# Patient Record
Sex: Male | Born: 1995 | Race: White | Hispanic: No | Marital: Single | Smoking: Former smoker
Health system: Southern US, Community
[De-identification: ages and names within clinical notes are randomized; demographics above are authoritative.]

## PROBLEM LIST (undated history)

## (undated) ENCOUNTER — Ambulatory Visit (HOSPITAL_COMMUNITY): Discharge status: Against Medical Advice | Payer: Medicaid Other

## (undated) DIAGNOSIS — J45909 Unspecified asthma, uncomplicated: Secondary | ICD-10-CM

## (undated) DIAGNOSIS — J020 Streptococcal pharyngitis: Secondary | ICD-10-CM

## (undated) DIAGNOSIS — F909 Attention-deficit hyperactivity disorder, unspecified type: Secondary | ICD-10-CM

## (undated) DIAGNOSIS — J309 Allergic rhinitis, unspecified: Secondary | ICD-10-CM

## (undated) HISTORY — DX: Unspecified asthma, uncomplicated: J45.909

## (undated) HISTORY — DX: Attention-deficit hyperactivity disorder, unspecified type: F90.9

## (undated) HISTORY — PX: TYMPANOSTOMY TUBE PLACEMENT: SHX32

## (undated) HISTORY — DX: Allergic rhinitis, unspecified: J30.9

## (undated) HISTORY — PX: TONSILLECTOMY: SUR1361

---
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
## 2016-01-01 ENCOUNTER — Ambulatory Visit: Payer: Medicaid Other | Admitting: Pediatrics

## 2016-01-08 ENCOUNTER — Ambulatory Visit: Payer: Medicaid Other | Admitting: Pediatrics

## 2016-03-13 ENCOUNTER — Encounter: Payer: Self-pay | Admitting: *Deleted

## 2016-07-03 ENCOUNTER — Emergency Department (HOSPITAL_COMMUNITY)
Admission: EM | Admit: 2016-07-03 | Discharge: 2016-07-03 | Disposition: A | Discharge status: Home / Self Care | Payer: Medicaid Other | Attending: Emergency Medicine | Admitting: Emergency Medicine

## 2016-07-03 ENCOUNTER — Emergency Department (HOSPITAL_COMMUNITY): Payer: Medicaid Other

## 2016-07-03 ENCOUNTER — Encounter (HOSPITAL_COMMUNITY): Payer: Self-pay | Admitting: Emergency Medicine

## 2016-07-03 DIAGNOSIS — Y939 Activity, unspecified: Secondary | ICD-10-CM | POA: Insufficient documentation

## 2016-07-03 DIAGNOSIS — J45909 Unspecified asthma, uncomplicated: Secondary | ICD-10-CM | POA: Diagnosis not present

## 2016-07-03 DIAGNOSIS — Y999 Unspecified external cause status: Secondary | ICD-10-CM | POA: Diagnosis not present

## 2016-07-03 DIAGNOSIS — S50311A Abrasion of right elbow, initial encounter: Secondary | ICD-10-CM | POA: Insufficient documentation

## 2016-07-03 DIAGNOSIS — F0781 Postconcussional syndrome: Secondary | ICD-10-CM | POA: Diagnosis not present

## 2016-07-03 DIAGNOSIS — F909 Attention-deficit hyperactivity disorder, unspecified type: Secondary | ICD-10-CM | POA: Diagnosis not present

## 2016-07-03 DIAGNOSIS — Y92410 Unspecified street and highway as the place of occurrence of the external cause: Secondary | ICD-10-CM | POA: Diagnosis not present

## 2016-07-03 DIAGNOSIS — S59901A Unspecified injury of right elbow, initial encounter: Secondary | ICD-10-CM | POA: Diagnosis present

## 2016-07-03 DIAGNOSIS — F17210 Nicotine dependence, cigarettes, uncomplicated: Secondary | ICD-10-CM | POA: Diagnosis not present
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
## 2017-05-13 ENCOUNTER — Emergency Department (HOSPITAL_COMMUNITY): Payer: Medicaid Other

## 2017-05-13 ENCOUNTER — Emergency Department (HOSPITAL_COMMUNITY)
Admission: EM | Admit: 2017-05-13 | Discharge: 2017-05-13 | Disposition: A | Discharge status: Home / Self Care | Payer: Medicaid Other | Attending: Emergency Medicine | Admitting: Emergency Medicine

## 2017-05-13 ENCOUNTER — Encounter (HOSPITAL_COMMUNITY): Payer: Self-pay | Admitting: Emergency Medicine

## 2017-05-13 DIAGNOSIS — F17200 Nicotine dependence, unspecified, uncomplicated: Secondary | ICD-10-CM | POA: Insufficient documentation

## 2017-05-13 DIAGNOSIS — R1032 Left lower quadrant pain: Secondary | ICD-10-CM | POA: Insufficient documentation

## 2017-05-13 DIAGNOSIS — K5909 Other constipation: Secondary | ICD-10-CM

## 2017-05-13 DIAGNOSIS — J45909 Unspecified asthma, uncomplicated: Secondary | ICD-10-CM | POA: Insufficient documentation

## 2017-05-13 LAB — COMPREHENSIVE METABOLIC PANEL
ALT: 13 U/L — ABNORMAL LOW (ref 17–63)
ANION GAP: 6 (ref 5–15)
AST: 20 U/L (ref 15–41)
Albumin: 4.7 g/dL (ref 3.5–5.0)
Alkaline Phosphatase: 84 U/L (ref 38–126)
BUN: 13 mg/dL (ref 6–20)
CO2: 28 mmol/L (ref 22–32)
Calcium: 9.2 mg/dL (ref 8.9–10.3)
Chloride: 104 mmol/L (ref 101–111)
Creatinine, Ser: 1.12 mg/dL (ref 0.61–1.24)
GFR calc non Af Amer: >60 mL/min (ref >60)
Glucose, Bld: 97 mg/dL (ref 65–99)
POTASSIUM: 3.7 mmol/L (ref 3.5–5.1)
SODIUM: 138 mmol/L (ref 135–145)
Total Bilirubin: 0.5 mg/dL (ref 0.3–1.2)
Total Protein: 6.6 g/dL (ref 6.5–8.1)

## 2017-05-13 LAB — URINALYSIS, ROUTINE W REFLEX MICROSCOPIC
Bacteria, UA: NONE SEEN
Bilirubin Urine: NEGATIVE
GLUCOSE, UA: NEGATIVE mg/dL
KETONES UR: NEGATIVE mg/dL
LEUKOCYTES UA: NEGATIVE
Nitrite: NEGATIVE
PROTEIN: NEGATIVE mg/dL
Specific Gravity, Urine: 1.019 (ref 1.005–1.030)
pH: 5 (ref 5.0–8.0)

## 2017-05-13 LAB — CBC
HCT: 46.4 % (ref 39.0–52.0)
HEMOGLOBIN: 16.3 g/dL (ref 13.0–17.0)
MCH: 30.2 pg (ref 26.0–34.0)
MCHC: 35.1 g/dL (ref 30.0–36.0)
MCV: 86.1 fL (ref 78.0–100.0)
Platelets: 173 10*3/uL (ref 150–400)
RBC: 5.39 MIL/uL (ref 4.22–5.81)
RDW: 12.7 % (ref 11.5–15.5)
WBC: 9.7 10*3/uL (ref 4.0–10.5)

## 2017-05-13 LAB — LIPASE, BLOOD: LIPASE: 21 U/L (ref 11–51)

## 2017-05-13 MED ORDER — HYDROMORPHONE HCL 1 MG/ML IJ SOLN
1.0000 mg | Freq: Once | INTRAMUSCULAR | Status: AC
  Administered 2017-05-13: 1 mg via INTRAVENOUS
  Filled 2017-05-13: qty 1

## 2017-05-13 NOTE — ED Triage Notes (Signed)
Pt reports he was woken out of his sleep w/ lower left abdominal pain.  Pt reports is it sharp and shooting.  Denied n/v/d, no weakness, dizziness or sob.

## 2017-05-13 NOTE — ED Notes (Signed)
Patient transported to CT 

## 2017-05-13 NOTE — ED Provider Notes (Signed)
[facility] Provider Note   CSN: [id number] Arrival date & time: 05/13/17  0446     History   Chief Complaint Chief Complaint  Patient presents with  . Abdominal Pain    HPI Jim Alexander is a 21 y.o. male.  HPI Patient presents to the emergency department with left lower abdominal discomfort and flank pain.  The patient states he woke up suddenly from sleep with this pain.  Patient states the pain spray, severe, sharp in nature. The patient denies chest pain, shortness of breath, headache,blurred vision, neck pain, fever, cough, weakness, numbness, dizziness, anorexia, edema,  nausea, vomiting, diarrhea, rash, back pain, dysuria, hematemesis, bloody stool, near syncope, or syncope.  Patient did not take any medications prior to arrival Past Medical History:  Diagnosis Date  . ADHD (attention deficit hyperactivity disorder)   . Allergic rhinitis   . Asthma   . Strep pharyngitis     Patient Active Problem List   Diagnosis Date Noted  . Folliculitis 12/17/2015  . Allergic rhinitis 04/17/2014  . 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 TONSILLECTOMY    . TYMPANOSTOMY TUBE PLACEMENT         Home Medications    Prior to Admission medications   Not on File    Family History No family history on file.  Social History Social History  Substance Use Topics  . Smoking status: Current Every Day Smoker  . Smokeless tobacco: Not on file     Comment: 4 cigarettes per day  . Alcohol use No     Allergies   Patient has no known allergies.   Review of Systems Review of Systems All other systems negative except as documented in the HPI. All pertinent positives and negatives as reviewed in the HPI.  Physical Exam Updated Vital Signs BP 119/76   Pulse (!) 54   Temp 97.7 F (36.5 C) (Oral)    Resp 16   Ht 6' (1.829 m)   Wt 67.6 kg (149 lb)   SpO2 93%   BMI 20.21 kg/m   Physical Exam  Constitutional: He is oriented to person, place, and time. He appears well-developed and well-nourished. No distress.  HENT:  Head: Normocephalic and atraumatic.  Mouth/Throat: Oropharynx is clear and moist.  Eyes: Pupils are equal, round, and reactive to light.  Neck: Normal range of motion. Neck supple.  Cardiovascular: Normal rate, regular rhythm and normal heart sounds.  Exam reveals no gallop and no friction rub.   No murmur heard. Pulmonary/Chest: Effort normal and breath sounds normal. No respiratory distress. He has no wheezes.  Abdominal: Soft. Bowel sounds are normal. He exhibits no distension and no mass. There is tenderness. There is no guarding.  Genitourinary: Testes normal and penis normal. Right testis shows no swelling and no tenderness. Left testis shows no swelling and no tenderness.  Neurological: He is alert and oriented to person, place, and time. He exhibits normal muscle tone. Coordination normal.  Skin: Skin is warm and dry. Capillary refill takes less than 2 seconds. No rash noted. No erythema.  Psychiatric: He has a normal mood and affect. His behavior is normal.  Nursing note and vitals reviewed.    ED Treatments / Results  Labs (all labs ordered are listed, but only abnormal results are displayed) Labs Reviewed  COMPREHENSIVE METABOLIC PANEL - Abnormal; Notable for  the following:       Result Value   ALT 13 (*)    All other components within normal limits  URINALYSIS, ROUTINE W REFLEX MICROSCOPIC - Abnormal; Notable for the following:    Hgb urine dipstick MODERATE (*)    Squamous Epithelial / LPF 0-5 (*)    All other components within normal limits  LIPASE, BLOOD  CBC    EKG  EKG Interpretation None       Radiology Ct Renal Stone Study  Result Date: 05/13/2017 CLINICAL DATA:  Lambert Mody lower left side pain that woke him up in his sleep. No other  complaints. EXAM: CT ABDOMEN AND PELVIS WITHOUT CONTRAST TECHNIQUE: Multidetector CT imaging of the abdomen and pelvis was performed following the standard protocol without IV contrast. COMPARISON:  Ultrasound 09/01/2011 FINDINGS: Lower chest: No acute abnormality. Hepatobiliary: No focal liver abnormality is seen. No gallstones, gallbladder wall thickening, or biliary dilatation. Pancreas: Unremarkable. No pancreatic ductal dilatation or surrounding inflammatory changes. Spleen: Normal in size without focal abnormality. Adrenals/Urinary Tract: Adrenal glands are unremarkable. Kidneys are normal, without renal calculi, focal lesion, or hydronephrosis. Bladder is unremarkable. Stomach/Bowel: Stomach and small bowel decompressed. Appendix not identified. No pericecal inflammatory/ edematous change. Moderate colonic fecal material. No wall thickening or regional inflammatory/ edematous change. Vascular/Lymphatic: Multiple small mesenteric lymph nodes are identified all less than 1 cm short axis diameter. No retroperitoneal or pelvic adenopathy. No significant vascular abnormality is identified. Reproductive: Prostate is unremarkable. Other: No ascites.  No free air. Musculoskeletal: No acute or significant osseous findings. IMPRESSION: 1. No acute findings. Electronically Signed   By: Corlis Leak M.D.   On: 05/13/2017 09:48    Procedures Procedures (including critical care time)  Medications Ordered in ED Medications  HYDROmorphone (DILAUDID) injection 1 mg (1 mg Intravenous Given 05/13/17 0740)     Initial Impression / Assessment and Plan / ED Course  I have reviewed the triage vital signs and the nursing notes.  Pertinent labs & imaging results that were available during my care of the patient were reviewed by me and considered in my medical decision making (see chart for details).   patient states that he currently does not have any pain.  Patient has not had any testicular pain or discomfort in the  testicles states that he does feel some shooting pain just above his penis, but nothing into the testicular region.  I did advise the patient that CT scan did not show any significant abnormalities at this time.  However, and I did advise that he could have passed a kidney stone is a possibility.  Also has constipation could be contributing, but not the full answer to his abdominal pain.  The patient is advised this could be an evolving process that is yet to  fully declared itself.  I advised him to return to the emergency department for any worsening in his condition   Final Clinical Impressions(s) / ED Diagnoses   Final diagnoses:  Left lower quadrant pain  Other constipation    New Prescriptions There are no discharge medications for this patient.    Charlestine Night, PA-C 05/13/17 1457    Tilden Fossa, MD 05/14/17 [phone]

## 2017-05-13 NOTE — Discharge Instructions (Signed)
Increase your fluid intake. Rest as much as possible. Take Miralax for constipation

## 2017-05-13 NOTE — ED Notes (Signed)
Patient c/o left lower abd. Pain onset this am states he was fine when he went to bed last pm. States he woke up with the pain. States he can't void at present. Denies n/v. States his last BM was 2 days ago of which is normal for him.

## 2017-05-13 NOTE — Medical Student Note (Signed)
[facility] DEPT Provider Student Note For educational purposes for Medical, PA and NP students only and not part of the legal medical record.   CSN: [id number] Arrival date & time: 05/13/17  0446     History   Chief Complaint Chief Complaint  Patient presents with  . Abdominal Pain    HPI Jim Alexander is a 21 y.o. male with no significant PMHx presents with abdominal pain. He is accompanied by his mother.  HPI  Patient states that the abdominal pain awoke him last night around 11pm. He notes that the pain is in his LLQ and radiates slightly to his suprapubic area. He states that the pain is a sharp/stabbing colicky pain that returns every 2 minutes and is 10/10 at its worst. He states that the pain is worsened with movement and standing. He denies using anything to alleviate his pain. He does report some pain with urination. He denies radiation to his back or RLQ. He denies the presence of any fevers, chills, N/V/D. He states that his last BM was 2 days ago, but states this is normal for him. He does state that he has had similar pain in 2014 which was determined to be constipation.   Past Medical History:  Diagnosis Date  . ADHD (attention deficit hyperactivity disorder)   . Allergic rhinitis   . Asthma   . Strep pharyngitis     Patient Active Problem List   Diagnosis Date Noted  . Folliculitis 12/17/2015  . Allergic rhinitis 04/17/2014  . 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 TONSILLECTOMY    . TYMPANOSTOMY TUBE PLACEMENT         Home Medications    Prior to Admission medications   Not on File    Family History No family history on file.  Social History Social History  Substance Use Topics  . Smoking status: Current Every Day Smoker  . Smokeless tobacco: Not on file     Comment: 4  cigarettes per day  . Alcohol use No     Allergies   Patient has no known allergies.   Review of Systems Review of Systems  Constitutional: Negative for chills, diaphoresis and fever.  HENT: Negative for congestion, rhinorrhea and sinus pressure.   Respiratory: Negative for cough.   Cardiovascular: Negative for chest pain and palpitations.  Gastrointestinal: Positive for abdominal pain (LLQ). Negative for blood in stool, constipation (last BM 2 days ago), diarrhea, nausea and vomiting.  Genitourinary: Positive for difficulty urinating and dysuria. Negative for discharge, flank pain, genital sores, hematuria and testicular pain.     Physical Exam Updated Vital Signs BP 123/87 (BP Location: Left Arm)   Pulse 70   Temp 97.7 F (36.5 C) (Oral)   Ht 6' (1.829 m)   Wt 67.6 kg   SpO2 99%   BMI 20.21 kg/m   Physical Exam  Constitutional: He is oriented to person, place, and time. He appears well-developed and well-nourished. No distress.  Cardiovascular: Normal rate, regular rhythm and normal heart sounds.   No murmur heard. Pulmonary/Chest: Effort normal and breath sounds normal. No respiratory distress.  Abdominal: Soft. He exhibits no distension and no mass. There is tenderness. There is guarding. No hernia.  Neurological: He is alert and oriented to person, place, and time.  Skin: Skin is warm and dry. He is not diaphoretic.  Nursing note and vitals reviewed.    ED Treatments / Results  Labs (all labs ordered are listed, but only abnormal results are displayed) Labs Reviewed  COMPREHENSIVE METABOLIC PANEL - Abnormal; Notable for the following:       Result Value   ALT 13 (*)    All other components within normal limits  LIPASE, BLOOD  CBC  URINALYSIS, ROUTINE W REFLEX MICROSCOPIC    EKG  EKG Interpretation None       Radiology No results found.  Procedures Procedures (including critical care time)  Medications Ordered in ED Medications - No data to  display   Initial Impression / Assessment and Plan / ED Course  I have reviewed the triage vital signs and the nursing notes.  Pertinent labs & imaging results that were available during my care of the patient were reviewed by me and considered in my medical decision making (see chart for details).   CBC, CMP, and Lipase unremarkable. UA shows moderate Hgb but no infection or RBCs present. Pain treated with Dilaudid.   CT scan performed to evaluate for nephrolithiasis negative for stones or any acute findings. Moderate colonic fecal material noted. Will treat for constipation. Encourage increased water intake, a balanced diet, Miralax and OTC stool softeners to increase regularity. Discussed with patient the possibility of him already passing a kidney stone that was not captured on CT. Patient advised if pair returns or worsens that he should return immediately.    Final Clinical Impressions(s) / ED Diagnoses   Final diagnoses:  None    New Prescriptions New Prescriptions   No medications on file

## 2019-02-14 ENCOUNTER — Emergency Department (HOSPITAL_COMMUNITY): Payer: Self-pay

## 2019-02-14 ENCOUNTER — Other Ambulatory Visit: Payer: Self-pay

## 2019-02-14 ENCOUNTER — Encounter (HOSPITAL_COMMUNITY): Payer: Self-pay | Admitting: Emergency Medicine

## 2019-02-14 ENCOUNTER — Emergency Department (HOSPITAL_COMMUNITY)
Admission: EM | Admit: 2019-02-14 | Discharge: 2019-02-14 | Disposition: A | Discharge status: Home / Self Care | Payer: Self-pay | Attending: Emergency Medicine | Admitting: Emergency Medicine

## 2019-02-14 DIAGNOSIS — F909 Attention-deficit hyperactivity disorder, unspecified type: Secondary | ICD-10-CM | POA: Insufficient documentation

## 2019-02-14 DIAGNOSIS — Y9389 Activity, other specified: Secondary | ICD-10-CM | POA: Insufficient documentation

## 2019-02-14 DIAGNOSIS — Z23 Encounter for immunization: Secondary | ICD-10-CM | POA: Insufficient documentation

## 2019-02-14 DIAGNOSIS — X58XXXA Exposure to other specified factors, initial encounter: Secondary | ICD-10-CM | POA: Insufficient documentation

## 2019-02-14 DIAGNOSIS — Y929 Unspecified place or not applicable: Secondary | ICD-10-CM | POA: Insufficient documentation

## 2019-02-14 DIAGNOSIS — F17210 Nicotine dependence, cigarettes, uncomplicated: Secondary | ICD-10-CM | POA: Insufficient documentation

## 2019-02-14 DIAGNOSIS — J45909 Unspecified asthma, uncomplicated: Secondary | ICD-10-CM | POA: Insufficient documentation

## 2019-02-14 DIAGNOSIS — S60455A Superficial foreign body of left ring finger, initial encounter: Secondary | ICD-10-CM | POA: Insufficient documentation

## 2019-02-14 DIAGNOSIS — S60459A Superficial foreign body of unspecified finger, initial encounter: Secondary | ICD-10-CM

## 2019-02-14 DIAGNOSIS — Y999 Unspecified external cause status: Secondary | ICD-10-CM | POA: Insufficient documentation

## 2019-02-14 MED ORDER — LIDOCAINE HCL 2 % IJ SOLN
10.0000 mL | Freq: Once | INTRAMUSCULAR | Status: AC
  Administered 2019-02-14: 17:00:00 20 mg
  Filled 2019-02-14: qty 20

## 2019-02-14 MED ORDER — CEPHALEXIN 500 MG PO CAPS: 500.0000 mg | ORAL_CAPSULE | Freq: Four times a day (QID) | ORAL | 0 refills | Status: DC

## 2019-02-14 MED ORDER — TETANUS-DIPHTH-ACELL PERTUSSIS 5-2.5-18.5 LF-MCG/0.5 IM SUSP
0.5000 mL | Freq: Once | INTRAMUSCULAR | Status: AC
  Administered 2019-02-14: 17:00:00 0.5 mL via INTRAMUSCULAR
  Filled 2019-02-14: qty 0.5

## 2019-02-14 NOTE — ED Triage Notes (Signed)
Pt states he has a splinter stuck in his left ring finger. He was able to remove some, but a majority is still in place and he can feel it when he bends the finger.

## 2019-02-14 NOTE — ED Notes (Signed)
Finger dressed in xeroform and nonstick. Secured with coban.

## 2019-02-14 NOTE — ED Notes (Signed)
Patient verbalizes understanding of discharge instructions. Opportunity for questioning and answers were provided. Armband removed by staff, pt discharged from ED.  

## 2019-02-14 NOTE — Discharge Instructions (Addendum)
Please see the information and instructions below regarding your visit.  Your diagnoses today include:  1. Splinter of finger     Tests performed today include: See side panel of your discharge paperwork for testing performed today. Vital signs are listed at the bottom of these instructions.   Medications prescribed:    Take any prescribed medications only as prescribed, and any over the counter medications only as directed on the packaging.  Please take all of your antibiotics until finished.   You may develop abdominal discomfort or nausea from the antibiotic. If this occurs, you may take it with food. Some patients also get diarrhea with antibiotics. You may help offset this with probiotics which you can buy or get in yogurt. Do not eat or take the probiotics until 2 hours after your antibiotic.Some women develop vaginal yeast infections after antibiotics. If you develop unusual vaginal discharge after being on this medication, please see your primary care provider.   Some people develop allergies to antibiotics. Symptoms of antibiotic allergy can be mild and include a flat rash and itching. They can also be more serious and include:  ?Hives - Hives are raised, red patches of skin that are usually very itchy.  ?Lip or tongue swelling  ?Trouble swallowing or breathing  ?Blistering of the skin or mouth.  If you have any of these serious symptoms, please seek emergency medical care immediately.   I recommend alternating ibuprofen and Tylenol for pain.  Home care instructions:  Please follow any educational materials contained in this packet.   Some things that may promote healing of your wound and infection include:  Reapply a dry gauze dressing any time the bandage has become soaked with drainage, or after cleansing the wound.  Please apply antibiotic ointment. Please purchase Epsom salts over-the-counter.  Please mix this with lukewarm water and soak the finger 3-4 times a day.   Please massage the finger to promote any drainage of further pieces of the splinter.   Return instructions:  Please return to the Emergency Department if you experience worsening symptoms. You should return for reevaluation of your infection if you notice spreading redness, increased swelling, an abscess develops, or you develop signs and symptoms of a systemic illness such as fever and chills.  Please return if you have any other emergent concerns.  Additional Information:   Your vital signs today were: BP (!) 149/70 (BP Location: Right Arm)    Pulse 76    Temp 97.8 F (36.6 C) (Oral)    Resp 16    Ht 5\' 7"  (1.702 m)    Wt 68 kg    SpO2 99%    BMI 23.48 kg/m  If your blood pressure (BP) was elevated on multiple readings during this visit above 130 for the top number or above 80 for the bottom number, please have this repeated by your primary care provider within one month. --------------  Thank you for allowing [place] to participate in your care today.

## 2019-02-14 NOTE — ED Triage Notes (Addendum)
Pt in with L ring finger splinter, c/o cold finger tip. Happened today while working. Cap refill <3 sec

## 2019-02-14 NOTE — ED Provider Notes (Signed)
[facility] EMERGENCY DEPARTMENT Provider Note   CSN: [id number] Arrival date & time: 02/14/19  1548    History   Chief Complaint Chief Complaint  Patient presents with  . Finger Injury    HPI Jim Alexander is a 23 y.o. male.     HPI   Patient is a 23 year old male with past medical history of ADHD, allergic rhinitis, and asthma presenting for splinter in the left ring finger.  Patient reports that he was moving a table for his mother earlier today prior to arrival when a broken piece went into his finger.  Patient reports that he was able to extract some of it but could not get all of it out he did still feel in his finger.  Patient reports that he can still bend the DIP joint without difficulty.  Denies any drainage or loss of sensation.  Unknown last tetanus shot.  Past Medical History:  Diagnosis Date  . ADHD (attention deficit hyperactivity disorder)   . Allergic rhinitis   . Asthma   . Strep pharyngitis     Patient Active Problem List   Diagnosis Date Noted  . Folliculitis 12/17/2015  . Allergic rhinitis 04/17/2014  . 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 TONSILLECTOMY    . TYMPANOSTOMY TUBE PLACEMENT          Home Medications    Prior to Admission medications   Not on File    Family History History reviewed. No pertinent family history.  Social History Social History   Tobacco Use  . Smoking status: Current Every Day Smoker  . Smokeless tobacco: Never Used  . Tobacco comment: 4 cigarettes per day  Substance Use Topics  . Alcohol use: No    Alcohol/week: 0.0 standard drinks  . Drug use: No     Allergies   Patient has no known allergies.   Review of Systems Review of Systems  Musculoskeletal: Positive for arthralgias.  Skin: Positive for wound.   Neurological: Negative for weakness and numbness.     Physical Exam Updated Vital Signs BP (!) 149/70 (BP Location: Right Arm)   Pulse 76   Temp 97.8 F (36.6 C) (Oral)   Resp 16   Ht  (1.702 m)   Wt 68 kg   SpO2 99%   BMI 23.48 kg/m   Physical Exam Vitals signs and nursing note reviewed.  Constitutional:      General: He is not in acute distress.    Appearance: He is well-developed. He is not diaphoretic.     Comments: Sitting comfortably in bed.  HENT:     Head: Normocephalic and atraumatic.  Eyes:     General:        Right eye: No discharge.        Left eye: No discharge.     Conjunctiva/sclera: Conjunctivae normal.     Comments: EOMs normal to gross examination.  Neck:     Musculoskeletal: Normal range of motion.  Cardiovascular:     Rate and Rhythm: Normal rate and regular rhythm.     Comments: Intact, 2+ radial pulse of LUE.  Pulmonary:     Comments: Patient converses comfortably without audible wheeze or stridor. Abdominal:     General: There is no distension.  Musculoskeletal: Normal range of motion.     Comments: See clinical  photo for details.  Patient has a dark foreign body that appears to be inserted into the skin on the flexor surface of the left ring finger.  Patient has full faculties of flexion and extension of the finger at the DIP joint.  Full sensation distally.  2+ capillary refill distally.  Skin:    General: Skin is warm and dry.  Neurological:     Mental Status: He is alert.     Comments: Cranial nerves intact to gross observation. Patient moves extremities without difficulty.  Psychiatric:        Behavior: Behavior normal.        Thought Content: Thought content normal.        Judgment: Judgment normal.        ED Treatments / Results  Labs (all labs ordered are listed, but only abnormal results are displayed) Labs Reviewed - No data to display  EKG None  Radiology No results found.  Procedures .Foreign Body Removal  Date/Time: 02/14/2019 8:04 PM Performed by: Gerhard MunchLockwood, Robert, MD Authorized by: Elisha PonderMurray, Chandler Stofer B, PA-C  Consent: Verbal consent obtained. Risks and benefits: risks, benefits and alternatives were discussed Consent given by: patient Patient understanding: patient states understanding of the procedure being performed Patient consent: the patient's understanding of the procedure matches consent given Imaging studies: imaging studies available Required items: required blood products, implants, devices, and special equipment available Patient identity confirmed: verbally with patient Intake: Finger. Anesthesia: digital block  Anesthesia: Local Anesthetic: lidocaine 2% without epinephrine  Sedation: Patient sedated: no  Complexity: simple 3 objects recovered. Objects recovered: Pieces of wood Post-procedure assessment: There is a possibility that residual foreign bodies remain. Patient tolerance: Patient tolerated the procedure well with no immediate complications Comments: A 1 cm incision was made superficially just proximal to the DIP joint of the left index finger along the radial aspect.  Tissue was marsupialized and piece of splinter removed.  A cruciate incision was made on the ulnar aspect of the left ring finger just proximal to the DIP joint.  Smaller pieces of the wood were removed.  There is a possibility of residual foreign body and this was discussed with the patient.    (including critical care time)  Medications Ordered in ED Medications - No data to display   Initial Impression / Assessment and Plan / ED Course  I have reviewed the triage vital signs and the nursing notes.  Pertinent labs & imaging results that were available during my care of the patient were reviewed by me and considered in my medical decision making (see chart for details).        Patient well-appearing and in no acute distress.  Patient neurovascularly intact in the left ring finger.  There  appears to be a linear foreign body superficially inserted on the flexor surface of the left ring finger.  Does not appear to be intra-articular as it does not interfere with flexion extension of the joint.  X-ray shows no foreign body or bony involvement.  Splinter was removed with superficial incisions at both the ulnar and the radial sites.  A 0.5 cm in length piece of dark wound was recovered as well as several smaller splinters.  There is a risk that there is residual splinter left in the finger and this was discussed with the patient.  He was encouraged to perform warm Epsom salt soaks several times a day and massaging the finger to promote further splinter removal.  Patient was given wound care instructions.  Also prescribed antibiotics prophylaxis.  Return precautions given for any increasing pain, swelling, or drainage.  Patient is in understanding and agrees with plan of care.  Final Clinical Impressions(s) / ED Diagnoses   Final diagnoses:  Splinter of finger    ED Discharge Orders         Ordered    cephALEXin (KEFLEX) 500 MG capsule  4 times daily     02/14/19 2006           Delia Chimes 02/14/19 2008    Gerhard Munch, MD 02/16/19 [phone]

## 2019-10-25 ENCOUNTER — Telehealth: Payer: Self-pay | Admitting: General Practice

## 2019-10-25 NOTE — Telephone Encounter (Signed)
pt is calling stating that he was exposed to a chemical at work and wants to make an appt - I explained that because it happened at work, he will have to open a worker's comp cse and they will have to call and open a case. This comes from everything I have been told at [facility]

## 2019-11-01 ENCOUNTER — Telehealth: Payer: Self-pay | Admitting: Internal Medicine

## 2019-11-01 ENCOUNTER — Other Ambulatory Visit: Payer: Self-pay

## 2019-11-01 ENCOUNTER — Ambulatory Visit (INDEPENDENT_AMBULATORY_CARE_PROVIDER_SITE_OTHER): Payer: Worker's Compensation | Admitting: Internal Medicine

## 2019-11-01 ENCOUNTER — Encounter: Payer: Self-pay | Admitting: Internal Medicine

## 2019-11-01 ENCOUNTER — Ambulatory Visit (INDEPENDENT_AMBULATORY_CARE_PROVIDER_SITE_OTHER): Payer: Worker's Compensation

## 2019-11-01 DIAGNOSIS — R0789 Other chest pain: Secondary | ICD-10-CM

## 2019-11-01 DIAGNOSIS — Z87891 Personal history of nicotine dependence: Secondary | ICD-10-CM | POA: Diagnosis not present

## 2019-11-01 NOTE — Telephone Encounter (Signed)
Tanda Rockers, MD  11/01/2019 10:07 AM EST    Call pt: Reviewed cxr and no acute change so no change in recommendations made at [facility] and spoke with pt letting him know the results of the cxr. Pt verbalized understanding. Nothing further needed.

## 2019-11-01 NOTE — Progress Notes (Signed)
LMTCB

## 2019-11-01 NOTE — Patient Instructions (Addendum)
Congratulations on not smoking, it's the most important aspect of your care  Please remember to go to the  x-ray department  for your tests - we will call you with the results when they are available    Call if any chest tightness or breathing problems recur and we'll do lung function testing.

## 2019-11-01 NOTE — Assessment & Plan Note (Signed)
Quit smoking 09/03/2019 Onset 09/28/2019 p exposure to cleaning solutions/ Peroxan BHP-70 containers that had been cleaned - resolved 09/30/2019 s rx  - 11/01/2019   Walked RA x two laps =  approx 51ft @ very fast pace - stopped due to end of study  with sats of 98% at the end of the study. - cxr 11/01/2019 wnl   Advised since no symptoms now it is unlikely he has either airway or parenchymal lung injury from solvent exposure but obviously should avoid in future and immediately vacate the area if symptoms recur and call for f/u.

## 2019-11-01 NOTE — Assessment & Plan Note (Addendum)
quit smoking 09/03/2019  No evidence of sequelae. 2 m p quit I reviewed the Fletcher curve with the patient that basically indicates  if you quit smoking when your best day FEV1 is still well preserved (as is clearly  the case here)  it is highly unlikely you will progress to severe disease and informed the patient there was  no medication on the market that has proven to alter the curve/ its downward trajectory  or the likelihood of progression of their disease(unlike other chronic medical conditions such as atheroclerosis where we do think we can change the natural hx with risk reducing meds)    Therefore   maintaining abstinence is  the most important aspect  of his care, not choice of inhalers or for that matter, doctors.   Treatment other than smoking cessation  is entirely directed by severity of symptoms and focused also on reducing exacerbations, not attempting to change the natural history of the disease.    If symptoms do develop in the future needs ov for full pfts but no reason to purse a study in an assymptomatic former smoker in the middle of a pandemic.   Total time devoted to counseling  > 50 % of initial 60 min office visit:  reviewed case with pt/ directly observed portions of ambulatory 02 saturation study/  discussion of options/alternatives/ personally creating written customized instructions  in presence of pt  then going over those specific  Instructions directly with the pt including how to use all of the meds but in particular covering each new medication in detail and the difference between the maintenance= "automatic" meds and the prns using an action plan format for the latter (If this problem/symptom => do that organization reading Left to right).  Please see AVS from this visit for a full list of these instructions which I personally wrote for this pt and  are unique to this visit.

## 2019-11-01 NOTE — Progress Notes (Signed)
Jim Alexander, male    DOB: 1996/06/02   MRN: [id number]   Brief patient profile:  52 yowm quit smoking 09/03/2019  And  fine x for trouble with ear/throat infections s/p tonsilectomy and ear tubes age 23 but no problem after that able to play basketball but exposed to Peroxan BHP-70 empty containers when delivered to workplace = [employer] where recycles plastic and these jugs were delivered "mostly empty" x 70 jugs p washing triple with ? 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    History of Present Illness  11/01/2019  Pulmonary/ 1st office eval/Vonya Ohalloran  Chief Complaint  Patient presents with  . Consult    Chemical Exposure Peroxan BHP-70  Dyspnea:  Back at work, lots of walking but no steps, no change in ex tol but not really aerobically active Cough: none  Sleep: no resp symptoms  SABA use: saba   No obvious day to day or daytime variability or assoc excess/ purulent sputum or mucus plugs or hemoptysis or cp or chest tightness, subjective wheeze or overt sinus or hb symptoms.   Sleeping ok  without nocturnal  or early am exacerbation  of respiratory  c/o's or need for noct saba. Also denies any obvious fluctuation of symptoms with weather or environmental changes or other aggravating or alleviating factors except as outlined above   No unusual exposure hx or h/o childhood pna/ asthma or knowledge of premature birth.  Current Allergies, Complete Past Medical History, Past Surgical History, Family History, and Social History were reviewed in [employer] record.  ROS  The following are not active complaints unless bolded Hoarseness, sore throat, dysphagia, dental problems, itching, sneezing,  nasal congestion or discharge of excess mucus or purulent secretions, ear ache,   fever, chills, sweats, unintended wt loss or wt gain, classically pleuritic or exertional cp,  orthopnea pnd or arm/hand swelling  or leg swelling, presyncope, palpitations, abdominal pain, anorexia, nausea, vomiting, diarrhea  or change in bowel habits or change in bladder habits, change in stools or change in urine, dysuria, hematuria,  rash, arthralgias, visual complaints, headache, numbness, weakness or ataxia or problems with walking or coordination,  change in mood or  memory.            Past Medical History:  Diagnosis Date  . ADHD (attention deficit hyperactivity disorder)   . Allergic rhinitis   . Asthma   . Strep pharyngitis        Objective:     BP 110/60 (BP Location: Left Arm, Cuff Size: Normal)   Pulse 67   Temp 98 F (36.7 C) (Temporal)   Ht 5\' 10"  (1.778 m)   Wt 154 lb 6.4 oz (70 kg)   SpO2 96% Comment: RA  BMI 22.15 kg/m   SpO2: 96 %(RA)    HEENT : pt wearing mask not removed for exam due to covid -19 concerns.    NECK :  without JVD/Nodes/TM/ nl carotid  upstrokes bilaterally   LUNGS: no acc muscle use,  Nl contour chest which is clear to A and P bilaterally without cough on insp or exp maneuvers   CV:  RRR  no s3 or murmur or increase in P2, and no edema   ABD:  soft and nontender with nl inspiratory excursion in the supine position. No bruits or organomegaly appreciated, bowel sounds nl  MS:  Nl gait/ ext warm without deformities, calf tenderness, cyanosis or clubbing No obvious joint restrictions   SKIN: warm and dry without lesions    NEURO:  alert, approp, nl sensorium with  no motor or cerebellar deficits apparent.   CXR PA and Lateral:   11/01/2019 :    I personally reviewed images and agree with  radiology impression as follows:     No acute abnormality of the lungs.       Assessment   Chest tightness Quit smoking 09/03/2019 Onset 09/28/2019 p exposure to cleaning solutions/ Peroxan BHP-70 containers that had been cleaned - resolved 09/30/2019 s rx  - 11/01/2019   Walked RA x two laps =  approx 513ft @ very fast pace - stopped due to end of study  with sats of 98% at the end of the study. - cxr 11/01/2019 wnl   Advised since no symptoms now it is unlikely he has either airway or parenchymal lung injury from solvent exposure but obviously should avoid in future and immediately vacate the area if symptoms recur and call for f/u.     History of smoking  No evidence of sequelae 2 m p quit smoking .    I reviewed the Fletcher curve with the patient that basically indicates  if you quit smoking when your best day FEV1 is still well preserved (as is clearly  the case here)  it is highly unlikely you will progress to severe disease and informed the patient there was  no medication on the market that has proven to alter the curve/ its downward trajectory  or the likelihood of progression of their disease(unlike other chronic medical conditions such as atheroclerosis where we do think we can change the natural hx with risk reducing meds)    Therefore   maintaining abstinence is  the most important aspect  of his care, not choice of inhalers or for that matter, doctors.   Treatment other than smoking cessation  is entirely directed by severity of symptoms and focused also on reducing exacerbations, not attempting to change the natural history of the disease.    If symptoms do develop in the future needs ov for full pfts but no reason to purse a study in an assymptomatic former smoker in the middle of a pandemic.    Total time devoted to counseling  > 50 % of initial 60 min office visit:  reviewed case with pt/ directly observed portions of ambulatory 02 saturation study/  discussion of  options/alternatives/ personally creating written customized instructions  in presence of pt  then going over those specific  Instructions directly with the pt including how to use all of the meds but in particular covering each new medication in detail and the difference between the maintenance= "automatic" meds and the prns using an action plan format for the latter (If this problem/symptom => do that organization reading Left to right).  Please see AVS from this visit for a full list of these instructions which I personally wrote for this pt and  are unique to this visit.  Sandrea HughsMichael Leatrice Parilla, MD 11/01/2019

## 2020-01-04 ENCOUNTER — Telehealth: Payer: Self-pay | Admitting: Internal Medicine

## 2020-01-04 NOTE — Telephone Encounter (Signed)
LVM for Jim Alexander to forward a signed release requesting the patient notes to be sent.   I looked up the number provided and it is not attached to an actual business. May be a person working from home for the company.

## 2020-10-02 ENCOUNTER — Encounter (HOSPITAL_COMMUNITY): Payer: Self-pay

## 2020-10-02 ENCOUNTER — Other Ambulatory Visit: Payer: Self-pay

## 2020-10-02 ENCOUNTER — Ambulatory Visit (INDEPENDENT_AMBULATORY_CARE_PROVIDER_SITE_OTHER): Payer: BC Managed Care – PPO

## 2020-10-02 ENCOUNTER — Ambulatory Visit (HOSPITAL_COMMUNITY)
Admission: EM | Admit: 2020-10-02 | Discharge: 2020-10-02 | Disposition: A | Discharge status: Home / Self Care | Payer: BC Managed Care – PPO | Attending: Family Medicine | Admitting: Family Medicine

## 2020-10-02 DIAGNOSIS — M25571 Pain in right ankle and joints of right foot: Secondary | ICD-10-CM

## 2020-10-02 DIAGNOSIS — S82831A Other fracture of upper and lower end of right fibula, initial encounter for closed fracture: Secondary | ICD-10-CM

## 2020-10-02 MED ORDER — HYDROCODONE-ACETAMINOPHEN 5-325 MG PO TABS: 1.0000 | ORAL_TABLET | Freq: Four times a day (QID) | ORAL | 0 refills | Status: DC | PRN

## 2020-10-02 NOTE — ED Provider Notes (Signed)
[facility]   [id number] 10/02/20 Arrival Time: 1620  ASSESSMENT & PLAN:  1. Acute right ankle pain     I have personally viewed the imaging studies ordered this visit. R fibula fracture.   Meds ordered this encounter  Medications  . HYDROcodone-acetaminophen (NORCO/VICODIN) 5-325 MG tablet    Sig: Take 1 tablet by mouth every 6 (six) hours as needed for moderate pain or severe pain.    Dispense:  12 tablet    Refill:  0   [place] Controlled Substances Registry consulted for this patient. I feel the risk/benefit ratio today is favorable for proceeding with this prescription for a controlled substance. Medication sedation precautions given.   Orders Placed This Encounter  Procedures  . DG Ankle Complete Right  . Crutches  . Apply splint short leg    Recommend:    Follow-up Information    Schedule an appointment as soon as possible for a visit  with Haddix, Gillie Manners, MD.   Specialty: Orthopedic Surgery Contact information: [address] [place] [address] [phone]                Reviewed expectations re: course of current medical issues. Questions answered. Outlined signs and symptoms indicating need for more acute intervention. Patient verbalized understanding. After Visit Summary given.  SUBJECTIVE: History from: patient. Jim Alexander is a 24 y.o. male who reports persistent moderate pain of his right ankle; described as aching; without radiation. Onset: abrupt. First noted: yesterday. Injury/trama: reports twisting/inversion injury; immed pain. Symptoms have progressed to a point and plateaued since beginning. Aggravating factors: certain movements and wt bearing. Alleviating factors: rest. Associated symptoms: none reported. Extremity sensation changes or weakness: none. Self treatment: NSAID, with no relief.  History of similar: no.  Past Surgical History:  Procedure Laterality Date  . TONSILLECTOMY    . TYMPANOSTOMY TUBE  PLACEMENT        OBJECTIVE:  Vitals:   10/02/20 1811  BP: 132/68  Pulse: 70  Resp: 18  Temp: 98.3 F (36.8 C)  TempSrc: Oral  SpO2: 100%    General appearance: alert; no distress HEENT: [place]; AT Neck: supple with FROM Resp: unlabored respirations Extremities: . RLE: warm with well perfused appearance; fairly well localized marked tenderness over right lateral ankle super to R malleolus; without gross deformities; swelling: moderate; bruising: minimal; ankle ROM: limited by reported pain CV: brisk extremity capillary refill of RLE; 2+ DP pulse of RLE. Skin: warm and dry; no visible rashes Neurologic: normal sensation and strength of RLE Psychological: alert and cooperative; normal mood and affect  Imaging: DG Ankle Complete Right  Result Date: 10/02/2020 CLINICAL DATA:  Fall, injury EXAM: RIGHT ANKLE - COMPLETE 3+ VIEW COMPARISON:  None. FINDINGS: Obliquely oriented trans syndesmotic fracture of the distal fibular metadiaphysis. No other visible fracture. No significant talar shift or tilt. Tiny rounded mixed sclerotic/lucent lesion along the talar dome may reflect a small geode formation or osteochondral lesion. Small ankle joint effusion. Circumferential swelling of the ankle most pronounced laterally adjacent the fracture line. IMPRESSION: 1. Obliquely oriented transsyndesmotic fracture of the distal fibular metadiaphysis. No significant talar shift or tilt. Likely Weber B stage II/III injury. 2. Tiny rounded mixed sclerotic/lucent lesion along the talar dome may reflect a small geode formation or osteochondral lesion. 3. Circumferential swelling most pronounced adjacent the fracture line. Small ankle joint effusion. Electronically Signed   By: Kreg Shropshire M.D.   On: 10/02/2020 18:50      No Known Allergies  Past Medical History:  Diagnosis Date  . ADHD (attention deficit hyperactivity disorder)   . Allergic rhinitis   . Asthma   . Strep pharyngitis    Social History    Socioeconomic History  . Marital status: Single    Spouse name: Not on file  . Number of children: Not on file  . Years of education: 24  . Highest education level: 12th grade  Occupational History  . Occupation: Loader  Tobacco Use  . Smoking status: Former Smoker    Packs/day: 1.00    Years: 4.00    Pack years: 4.00    Quit date: 09/03/2019    Years since quitting: 1.0  . Smokeless tobacco: Never Used  . Tobacco comment: 4 cigarettes per day  Substance and Sexual Activity  . Alcohol use: Yes    Alcohol/week: 12.0 standard drinks    Types: 12 Cans of beer per week  . Drug use: No  . Sexual activity: Yes  Other Topics Concern  . Not on file  Social History Narrative  . Not on file   Social Determinants of Health   Financial Resource Strain:   . Difficulty of Paying Living Expenses: Not on file  Food Insecurity:   . Worried About Programme researcher, broadcasting/film/video in the Last Year: Not on file  . Ran Out of Food in the Last Year: Not on file  Transportation Needs:   . Lack of Transportation (Medical): Not on file  . Lack of Transportation (Non-Medical): Not on file  Physical Activity:   . Days of Exercise per Week: Not on file  . Minutes of Exercise per Session: Not on file  Stress:   . Feeling of Stress : Not on file  Social Connections:   . Frequency of Communication with Friends and Family: Not on file  . Frequency of Social Gatherings with Friends and Family: Not on file  . Attends Religious Services: Not on file  . Active Member of Clubs or Organizations: Not on file  . Attends Banker Meetings: Not on file  . Marital Status: Not on file   History reviewed. No pertinent family history. Past Surgical History:  Procedure Laterality Date  . TONSILLECTOMY    . TYMPANOSTOMY TUBE PLACEMENT        Mardella Layman, MD 10/07/20 [phone]

## 2020-10-02 NOTE — ED Triage Notes (Signed)
Pt presents with right ankle injury after stepping down on it the wrong way last night while carrying something.

## 2020-10-02 NOTE — Discharge Instructions (Addendum)

## 2020-10-02 NOTE — Progress Notes (Signed)
Orthopedic Tech Progress Note Patient Details:  Jim Alexander September 24, 1996 [id number]  Ortho Devices Type of Ortho Device: Post (short) splint, Stirrup splint Splint Material: Fiberglass Ortho Device/Splint Location: LLE Ortho Device/Splint Interventions: Application, Adjustment, Ordered   Post Interventions Patient Tolerated: Well Instructions Provided: Care of device   Jim Alexander [name] 10/02/2020, 7:53 PM

## 2020-10-11 ENCOUNTER — Other Ambulatory Visit (HOSPITAL_COMMUNITY): Payer: BC Managed Care – PPO

## 2020-10-11 NOTE — Progress Notes (Signed)
I was unable to reach [employer] by phone.  I left  A message on voice mail.  I instructed the patient to arrive at [facility] Main entrance at 0530 on 10/14/20 , register in the Admitting Office. DO NOT eat or drink anything after midnight.  I instructed the patient to take the following medications in the am with just enough water to get them down: Vicodin if needed. I instructed patient to shower,  to not wear any lotions, powders, cologne, jewelry, piercing, make-up or nail polish.  Wear clean clothes. Brush teeth. I informed patient that there will need to be a driver and someone to stay with him/her for the first 24 hours after surgery.   I instructed  patient to call [phone], in the am if there were any questions or problems.

## 2020-10-14 ENCOUNTER — Ambulatory Visit (HOSPITAL_COMMUNITY): Payer: BC Managed Care – PPO

## 2020-10-14 ENCOUNTER — Ambulatory Visit (HOSPITAL_COMMUNITY): Payer: BC Managed Care – PPO | Admitting: Anesthesiology

## 2020-10-14 ENCOUNTER — Other Ambulatory Visit: Payer: Self-pay

## 2020-10-14 ENCOUNTER — Encounter (HOSPITAL_COMMUNITY)
Admission: RE | Disposition: A | Discharge status: Home / Self Care | Payer: Self-pay | Source: Home / Self Care | Attending: Orthopedic Surgery

## 2020-10-14 ENCOUNTER — Encounter (HOSPITAL_COMMUNITY): Payer: Self-pay | Admitting: Orthopedic Surgery

## 2020-10-14 ENCOUNTER — Ambulatory Visit (HOSPITAL_COMMUNITY)
Admission: RE | Admit: 2020-10-14 | Discharge: 2020-10-14 | Disposition: A | Discharge status: Home / Self Care | Payer: BC Managed Care – PPO | Attending: Orthopedic Surgery | Admitting: Orthopedic Surgery

## 2020-10-14 DIAGNOSIS — S8261XA Displaced fracture of lateral malleolus of right fibula, initial encounter for closed fracture: Secondary | ICD-10-CM | POA: Insufficient documentation

## 2020-10-14 DIAGNOSIS — W19XXXA Unspecified fall, initial encounter: Secondary | ICD-10-CM | POA: Diagnosis not present

## 2020-10-14 DIAGNOSIS — T148XXA Other injury of unspecified body region, initial encounter: Secondary | ICD-10-CM

## 2020-10-14 DIAGNOSIS — Z20822 Contact with and (suspected) exposure to covid-19: Secondary | ICD-10-CM | POA: Insufficient documentation

## 2020-10-14 DIAGNOSIS — Z87891 Personal history of nicotine dependence: Secondary | ICD-10-CM | POA: Insufficient documentation

## 2020-10-14 DIAGNOSIS — S82841A Displaced bimalleolar fracture of right lower leg, initial encounter for closed fracture: Secondary | ICD-10-CM | POA: Diagnosis present

## 2020-10-14 DIAGNOSIS — Z419 Encounter for procedure for purposes other than remedying health state, unspecified: Secondary | ICD-10-CM

## 2020-10-14 HISTORY — PX: ORIF FIBULA FRACTURE: SHX5114

## 2020-10-14 LAB — SURGICAL PCR SCREEN
MRSA, PCR: NEGATIVE
Staphylococcus aureus: POSITIVE — AB

## 2020-10-14 LAB — SARS CORONAVIRUS 2 BY RT PCR (HOSPITAL ORDER, PERFORMED IN ~~LOC~~ HOSPITAL LAB): SARS Coronavirus 2: NEGATIVE

## 2020-10-14 SURGERY — OPEN REDUCTION INTERNAL FIXATION (ORIF) FIBULA FRACTURE
Anesthesia: General | Site: Leg Lower | Laterality: Right

## 2020-10-14 MED ORDER — EPHEDRINE SULFATE-NACL 50-0.9 MG/10ML-% IV SOSY
PREFILLED_SYRINGE | INTRAVENOUS | Status: DC | PRN
  Administered 2020-10-14 (×2): 5 mg via INTRAVENOUS

## 2020-10-14 MED ORDER — HYDROCODONE-ACETAMINOPHEN 5-325 MG PO TABS: 1.0000 | ORAL_TABLET | Freq: Three times a day (TID) | ORAL | 0 refills | Status: DC | PRN

## 2020-10-14 MED ORDER — FENTANYL CITRATE (PF) 250 MCG/5ML IJ SOLN
INTRAMUSCULAR | Status: AC
  Filled 2020-10-14: qty 5

## 2020-10-14 MED ORDER — BUPIVACAINE-EPINEPHRINE 0.5% -1:200000 IJ SOLN
INTRAMUSCULAR | Status: AC
  Filled 2020-10-14: qty 1

## 2020-10-14 MED ORDER — PROPOFOL 1000 MG/100ML IV EMUL
INTRAVENOUS | Status: AC
  Filled 2020-10-14: qty 100

## 2020-10-14 MED ORDER — CHLORHEXIDINE GLUCONATE 0.12 % MT SOLN
15.0000 mL | Freq: Once | OROMUCOSAL | Status: AC
  Administered 2020-10-14: 07:00:00 15 mL via OROMUCOSAL
  Filled 2020-10-14: qty 15

## 2020-10-14 MED ORDER — PROPOFOL 10 MG/ML IV BOLUS
INTRAVENOUS | Status: AC
  Filled 2020-10-14: qty 40

## 2020-10-14 MED ORDER — DEXMEDETOMIDINE HCL 200 MCG/2ML IV SOLN
INTRAVENOUS | Status: DC | PRN
  Administered 2020-10-14 (×2): 4 ug via INTRAVENOUS
  Administered 2020-10-14: 8 ug via INTRAVENOUS

## 2020-10-14 MED ORDER — PROPOFOL 500 MG/50ML IV EMUL
INTRAVENOUS | Status: DC | PRN
  Administered 2020-10-14: 75 ug/kg/min via INTRAVENOUS

## 2020-10-14 MED ORDER — OXYCODONE HCL 5 MG/5ML PO SOLN: 5.0000 mg | Freq: Once | ORAL | Status: DC | PRN

## 2020-10-14 MED ORDER — OXYCODONE HCL 5 MG PO TABS: 5.0000 mg | ORAL_TABLET | Freq: Once | ORAL | Status: DC | PRN

## 2020-10-14 MED ORDER — CEFAZOLIN SODIUM-DEXTROSE 2-4 GM/100ML-% IV SOLN
INTRAVENOUS | Status: AC
  Filled 2020-10-14: qty 100

## 2020-10-14 MED ORDER — FENTANYL CITRATE (PF) 100 MCG/2ML IJ SOLN: 25.0000 ug | INTRAMUSCULAR | Status: DC | PRN

## 2020-10-14 MED ORDER — ONDANSETRON HCL 4 MG/2ML IJ SOLN
4.0000 mg | Freq: Once | INTRAMUSCULAR | Status: AC | PRN
  Administered 2020-10-14: 11:00:00 4 mg via INTRAVENOUS

## 2020-10-14 MED ORDER — LACTATED RINGERS IV SOLN: INTRAVENOUS | Status: DC

## 2020-10-14 MED ORDER — 0.9 % SODIUM CHLORIDE (POUR BTL) OPTIME
TOPICAL | Status: DC | PRN
  Administered 2020-10-14: 10:00:00 1000 mL

## 2020-10-14 MED ORDER — ONDANSETRON 4 MG PO TBDP: 4.0000 mg | ORAL_TABLET | Freq: Three times a day (TID) | ORAL | 0 refills | Status: DC | PRN

## 2020-10-14 MED ORDER — ORAL CARE MOUTH RINSE: 15.0000 mL | Freq: Once | OROMUCOSAL | Status: AC

## 2020-10-14 MED ORDER — LIDOCAINE HCL (CARDIAC) PF 100 MG/5ML IV SOSY
PREFILLED_SYRINGE | INTRAVENOUS | Status: DC | PRN
  Administered 2020-10-14: 40 mg via INTRAVENOUS

## 2020-10-14 MED ORDER — ASPIRIN EC 325 MG PO TBEC: 325.0000 mg | DELAYED_RELEASE_TABLET | Freq: Every day | ORAL | 0 refills | Status: DC

## 2020-10-14 MED ORDER — FENTANYL CITRATE (PF) 100 MCG/2ML IJ SOLN
INTRAMUSCULAR | Status: AC
  Filled 2020-10-14: qty 2

## 2020-10-14 MED ORDER — MIDAZOLAM HCL 2 MG/2ML IJ SOLN
INTRAMUSCULAR | Status: DC | PRN
  Administered 2020-10-14: 2 mg via INTRAVENOUS

## 2020-10-14 MED ORDER — MIDAZOLAM HCL 2 MG/2ML IJ SOLN
INTRAMUSCULAR | Status: AC
  Filled 2020-10-14: qty 2

## 2020-10-14 MED ORDER — DIPHENHYDRAMINE HCL 50 MG/ML IJ SOLN
INTRAMUSCULAR | Status: DC | PRN
  Administered 2020-10-14: 12.5 mg via INTRAVENOUS

## 2020-10-14 MED ORDER — DEXMEDETOMIDINE (PRECEDEX) IN NS 20 MCG/5ML (4 MCG/ML) IV SYRINGE
PREFILLED_SYRINGE | INTRAVENOUS | Status: AC
  Filled 2020-10-14: qty 5

## 2020-10-14 MED ORDER — PROPOFOL 10 MG/ML IV BOLUS
INTRAVENOUS | Status: DC | PRN
  Administered 2020-10-14: 150 mg via INTRAVENOUS

## 2020-10-14 MED ORDER — CEFAZOLIN SODIUM-DEXTROSE 2-3 GM-%(50ML) IV SOLR
INTRAVENOUS | Status: DC | PRN
  Administered 2020-10-14: 2 g via INTRAVENOUS

## 2020-10-14 MED ORDER — KETOROLAC TROMETHAMINE 10 MG PO TABS: 10.0000 mg | ORAL_TABLET | Freq: Four times a day (QID) | ORAL | 0 refills | Status: DC | PRN

## 2020-10-14 MED ORDER — FENTANYL CITRATE (PF) 100 MCG/2ML IJ SOLN
INTRAMUSCULAR | Status: DC | PRN
  Administered 2020-10-14 (×5): 50 ug via INTRAVENOUS

## 2020-10-14 MED ORDER — METHOCARBAMOL 500 MG PO TABS: 500.0000 mg | ORAL_TABLET | Freq: Four times a day (QID) | ORAL | 0 refills | Status: DC | PRN

## 2020-10-14 SURGICAL SUPPLY — 82 items
BANDAGE ESMARK 6X9 LF (GAUZE/BANDAGES/DRESSINGS) IMPLANT
BIT DRILL 2.4X140 LONG SOLID (BIT) ×6 IMPLANT
BIT DRILL SOLID 2.0 X 110MM (DRILL) ×1 IMPLANT
BNDG COHESIVE 6X5 TAN STRL LF (GAUZE/BANDAGES/DRESSINGS) ×3 IMPLANT
BNDG ELASTIC 4X5.8 VLCR STR LF (GAUZE/BANDAGES/DRESSINGS) ×3 IMPLANT
BNDG ELASTIC 6X5.8 VLCR STR LF (GAUZE/BANDAGES/DRESSINGS) ×3 IMPLANT
BNDG ESMARK 6X9 LF (GAUZE/BANDAGES/DRESSINGS)
BNDG GAUZE ELAST 4 BULKY (GAUZE/BANDAGES/DRESSINGS) IMPLANT
BOOT STEPPER DURA LG (SOFTGOODS) ×3 IMPLANT
BRUSH SCRUB EZ PLAIN DRY (MISCELLANEOUS) ×6 IMPLANT
CLEANER TIP ELECTROSURG 2X2 (MISCELLANEOUS) ×3 IMPLANT
CLOSURE WOUND 1/2 X4 (GAUZE/BANDAGES/DRESSINGS)
COVER MAYO STAND STRL (DRAPES) ×6 IMPLANT
COVER SURGICAL LIGHT HANDLE (MISCELLANEOUS) ×6 IMPLANT
CUFF TOURN SGL QUICK 34 (TOURNIQUET CUFF) ×3
CUFF TRNQT CYL 34X4.125X (TOURNIQUET CUFF) ×1 IMPLANT
DRAPE C-ARM 42X72 X-RAY (DRAPES) ×3 IMPLANT
DRAPE C-ARMOR (DRAPES) ×3 IMPLANT
DRAPE INCISE IOBAN 66X45 STRL (DRAPES) ×3 IMPLANT
DRAPE OEC MINIVIEW 54X84 (DRAPES) IMPLANT
DRAPE ORTHO SPLIT 77X108 STRL (DRAPES) ×3
DRAPE SURG ORHT 6 SPLT 77X108 (DRAPES) ×1 IMPLANT
DRAPE U-SHAPE 47X51 STRL (DRAPES) ×3 IMPLANT
DRILL SOLID 2.0 X 110MM (DRILL) ×3
DRSG ADAPTIC 3X8 NADH LF (GAUZE/BANDAGES/DRESSINGS) ×3 IMPLANT
ELECT REM PT RETURN 9FT ADLT (ELECTROSURGICAL) ×3
ELECTRODE REM PT RTRN 9FT ADLT (ELECTROSURGICAL) ×1 IMPLANT
EVACUATOR 1/8 PVC DRAIN (DRAIN) IMPLANT
GAUZE SPONGE 4X4 12PLY STRL (GAUZE/BANDAGES/DRESSINGS) ×3 IMPLANT
GLOVE BIO SURGEON STRL SZ7.5 (GLOVE) ×6 IMPLANT
GLOVE BIO SURGEON STRL SZ8 (GLOVE) ×6 IMPLANT
GLOVE BIOGEL PI IND STRL 7.5 (GLOVE) ×1 IMPLANT
GLOVE BIOGEL PI IND STRL 8 (GLOVE) ×2 IMPLANT
GLOVE BIOGEL PI INDICATOR 7.5 (GLOVE) ×2
GLOVE BIOGEL PI INDICATOR 8 (GLOVE) ×4
GOWN STRL REUS W/ TWL LRG LVL3 (GOWN DISPOSABLE) ×2 IMPLANT
GOWN STRL REUS W/ TWL XL LVL3 (GOWN DISPOSABLE) ×1 IMPLANT
GOWN STRL REUS W/TWL 2XL LVL3 (GOWN DISPOSABLE) ×3 IMPLANT
GOWN STRL REUS W/TWL LRG LVL3 (GOWN DISPOSABLE) ×6
GOWN STRL REUS W/TWL XL LVL3 (GOWN DISPOSABLE) ×3
K-WIRE SMOOTH TROCAR 2.0X150 (WIRE) ×3
KIT BASIN OR (CUSTOM PROCEDURE TRAY) ×3 IMPLANT
KIT TURNOVER KIT B (KITS) ×3 IMPLANT
KWIRE SMOOTH TROCAR 2.0X150 (WIRE) ×1 IMPLANT
MANIFOLD NEPTUNE II (INSTRUMENTS) IMPLANT
NEEDLE 22X1 1/2 (OR ONLY) (NEEDLE) IMPLANT
NS IRRIG 1000ML POUR BTL (IV SOLUTION) ×3 IMPLANT
PACK ORTHO EXTREMITY (CUSTOM PROCEDURE TRAY) ×3 IMPLANT
PAD ARMBOARD 7.5X6 YLW CONV (MISCELLANEOUS) ×6 IMPLANT
PAD CAST 3X4 CTTN HI CHSV (CAST SUPPLIES) ×1 IMPLANT
PADDING CAST COTTON 3X4 STRL (CAST SUPPLIES) ×3
PADDING CAST COTTON 6X4 STRL (CAST SUPPLIES) ×3 IMPLANT
PLATE FIB 11H ANATOMICAL RT (Plate) ×3 IMPLANT
SCREW LOCK PLATE R3 2.7X11 (Screw) ×3 IMPLANT
SCREW LOCK PLATE R3 2.7X14 (Screw) ×3 IMPLANT
SCREW LOCK PLATE R3 2.7X15 (Screw) ×3 IMPLANT
SCREW LOCK PLATE R3 2.7X16 (Screw) ×3 IMPLANT
SCREW LOCK PLATE R3 2.7X17 (Screw) ×3 IMPLANT
SCREW LOCK PLATE R3 3.5X12 (Screw) ×3 IMPLANT
SCREW NON LOCK PLATE 2.7X16M (Screw) ×6 IMPLANT
SCREW NON LOCKING 3.5X12 (Screw) ×12 IMPLANT
SPONGE LAP 18X18 RF (DISPOSABLE) ×3 IMPLANT
STAPLER VISISTAT 35W (STAPLE) ×3 IMPLANT
STOCKINETTE IMPERVIOUS LG (DRAPES) ×3 IMPLANT
STRIP CLOSURE SKIN 1/2X4 (GAUZE/BANDAGES/DRESSINGS) IMPLANT
SUCTION FRAZIER HANDLE 10FR (MISCELLANEOUS) ×3
SUCTION TUBE FRAZIER 10FR DISP (MISCELLANEOUS) ×1 IMPLANT
SUT ETHILON 2 0 PSLX (SUTURE) ×3 IMPLANT
SUT ETHILON 3 0 PS 1 (SUTURE) IMPLANT
SUT PDS AB 2-0 CT1 27 (SUTURE) IMPLANT
SUT PROLENE 0 CT 1 30 (SUTURE) IMPLANT
SUT VIC AB 0 CT1 27 (SUTURE)
SUT VIC AB 0 CT1 27XBRD ANBCTR (SUTURE) IMPLANT
SUT VIC AB 2-0 CT1 27 (SUTURE) ×3
SUT VIC AB 2-0 CT1 TAPERPNT 27 (SUTURE) ×1 IMPLANT
SYR CONTROL 10ML LL (SYRINGE) ×3 IMPLANT
TOWEL GREEN STERILE (TOWEL DISPOSABLE) ×6 IMPLANT
TOWEL GREEN STERILE FF (TOWEL DISPOSABLE) ×6 IMPLANT
TUBE CONNECTING 12'X1/4 (SUCTIONS) ×1
TUBE CONNECTING 12X1/4 (SUCTIONS) ×2 IMPLANT
UNDERPAD 30X36 HEAVY ABSORB (UNDERPADS AND DIAPERS) ×3 IMPLANT
YANKAUER SUCT BULB TIP NO VENT (SUCTIONS) ×3 IMPLANT

## 2020-10-14 NOTE — Anesthesia Procedure Notes (Signed)
Procedure Name: LMA Insertion Date/Time: 10/14/2020 10:05 AM Performed by: Marena Chancy, CRNA Pre-anesthesia Checklist: Patient identified, Emergency Drugs available, Suction available and Patient being monitored Patient Re-evaluated:Patient Re-evaluated prior to induction Oxygen Delivery Method: Circle System Utilized Preoxygenation: Pre-oxygenation with 100% oxygen Induction Type: IV induction Ventilation: Mask ventilation without difficulty LMA: LMA inserted LMA Size: 4.0 and 5.0 Number of attempts: 1 Airway Equipment and Method: Bite block Placement Confirmation: positive ETCO2 Tube secured with: Tape Dental Injury: Teeth and Oropharynx as per pre-operative assessment  Comments: lma 4 placed.  Leak noted.  lma 4 removed and replaced with lma 5.  Good seal achieved.

## 2020-10-14 NOTE — Anesthesia Postprocedure Evaluation (Signed)
Anesthesia Post Note  Patient: Jim Alexander  Procedure(s) Performed: OPEN REDUCTION INTERNAL FIXATION (ORIF) DISTAL FIBULA FRACTURE, (Right Leg Lower)     Patient location during evaluation: PACU Anesthesia Type: General and Regional Level of consciousness: awake and alert Pain management: pain level controlled Vital Signs Assessment: post-procedure vital signs reviewed and stable Respiratory status: spontaneous breathing, nonlabored ventilation, respiratory function stable and patient connected to nasal cannula oxygen Cardiovascular status: blood pressure returned to baseline and stable Postop Assessment: no apparent nausea or vomiting Anesthetic complications: no   No complications documented.  Last Vitals:  Vitals:   10/14/20 1200 10/14/20 1215  BP: 116/66 120/70  Pulse: 63 63  Resp: 20 16  Temp: 36.9 C   SpO2: 100% 100%    Last Pain:  Vitals:   10/14/20 1215  TempSrc:   PainSc: 0-No pain                 Trellis Guirguis COKER

## 2020-10-14 NOTE — Progress Notes (Signed)
Order for covid test was put in as the 6-24hour, not the rapid.  Called micro and it was run as a rapid at 0755.  Dr. Carola Frost, Dr. Noreene Larsson, OR 3, and Zack, CRNA notified of delay.

## 2020-10-14 NOTE — Evaluation (Signed)
Physical Therapy Evaluation and Discharge Patient Details Name: Jim Alexander MRN: [id number] DOB: Sep 02, 1996 Today's Date: 10/14/2020   History of Present Illness  Pt is a 24 y/o male s/p ORIF of R Ankle fx. PMH includes ADHD.  Clinical Impression  Patient evaluated by Physical Therapy with no further acute PT needs identified. All education has been completed and the patient has no further questions. Pt overall steady, performing at a supervision to mod I level. No LOB noted, and pt asymptomatic throughout. Reports his parents can assist if needed. Pt will benefit from outpatient PT once cleared by MD. See below for any follow-up Physical Therapy or equipment needs. PT is signing off. Thank you for this referral. If needs change, please re-consult.      Follow Up Recommendations No PT follow up    Equipment Recommendations  None recommended by PT    Recommendations for Other Services       Precautions / Restrictions Precautions Precautions: Fall Required Braces or Orthoses: Other Brace Other Brace: CAM walker boot Restrictions Weight Bearing Restrictions: Yes RLE Weight Bearing: Non weight bearing      Mobility  Bed Mobility Overal bed mobility: Modified Independent                  Transfers Overall transfer level: Modified independent                  Ambulation/Gait Ambulation/Gait assistance: Supervision Gait Distance (Feet): 50 Feet Assistive device: Crutches Gait Pattern/deviations: Step-to pattern Gait velocity: Decreased   General Gait Details: Hop to pattern usin crutches good technique noted with use of crutches. Pt asymptomatic throughout. Supervision for safety.  Stairs            Wheelchair Mobility    Modified Rankin (Stroke Patients Only)       Balance Overall balance assessment: Needs assistance Sitting-balance support: No upper extremity supported Sitting balance-Leahy Scale: Good     Standing balance support:  Bilateral upper extremity supported Standing balance-Leahy Scale: Poor Standing balance comment: Reliant on BUE support                             Pertinent Vitals/Pain Pain Assessment: No/denies pain    Home Living Family/patient expects to be discharged to:: Private residence Living Arrangements: Parent Available Help at Discharge: Family Type of Home: House Home Access: Level entry     Home Layout: One level Home Equipment: Crutches      Prior Function Level of Independence: Independent with assistive device(s)         Comments: after falling, had been using crutches. Previous to that was independent.     Hand Dominance        Extremity/Trunk Assessment   Upper Extremity Assessment Upper Extremity Assessment: Overall WFL for tasks assessed    Lower Extremity Assessment Lower Extremity Assessment: RLE deficits/detail RLE Deficits / Details: numb from block; deficits consisten with post op pain and weakness    Cervical / Trunk Assessment Cervical / Trunk Assessment: Normal  Communication   Communication: No difficulties  Cognition Arousal/Alertness: Awake/alert Behavior During Therapy: WFL for tasks assessed/performed Overall Cognitive Status: Within Functional Limits for tasks assessed                                        General Comments  Exercises     Assessment/Plan    PT Assessment Patent does not need any further PT services  PT Problem List         PT Treatment Interventions      PT Goals (Current goals can be found in the Care Plan section)  Acute Rehab PT Goals Patient Stated Goal: to go home PT Goal Formulation: With patient Time For Goal Achievement: 10/14/20 Potential to Achieve Goals: Good    Frequency     Barriers to discharge        Co-evaluation               AM-PAC PT "6 Clicks" Mobility  Outcome Measure Help needed turning from your back to your side while in a flat bed  without using bedrails?: None Help needed moving from lying on your back to sitting on the side of a flat bed without using bedrails?: None Help needed moving to and from a bed to a chair (including a wheelchair)?: None Help needed standing up from a chair using your arms (e.g., wheelchair or bedside chair)?: None Help needed to walk in hospital room?: None Help needed climbing 3-5 steps with a railing? : A Little 6 Click Score: 23    End of Session Equipment Utilized During Treatment: Other (comment) (CAM walker boot) Activity Tolerance: Patient tolerated treatment well Patient left: with nursing/sitter in room (in [facility] with NT present) Nurse Communication: Mobility status PT Visit Diagnosis: Other abnormalities of gait and mobility (R26.89)    Time: 0240-9735 PT Time Calculation (min) (ACUTE ONLY): 16 min   Charges:   PT Evaluation $PT Eval Low Complexity: 1 Low          Cindee Salt, DPT  Acute Rehabilitation Services  Pager: [phone] Office: [phone]   Lehman Prom 10/14/2020, 1:06 PM

## 2020-10-14 NOTE — Anesthesia Procedure Notes (Signed)
Anesthesia Regional Block: Adductor canal block   Pre-Anesthetic Checklist: ,, timeout performed, Correct Patient, Correct Site, Correct Laterality, Correct Procedure, Correct Position, site marked, Risks and benefits discussed, Surgical consent,  Pre-op evaluation,  At surgeon's request  Laterality: Right  Prep: chloraprep       Needles:  Injection technique: Single-shot  Needle Type: Other      Needle Gauge: 22     Additional Needles:   Narrative:  Start time: 10/14/2020 7:40 AM End time: 10/14/2020 7:45 AM Injection made incrementally with aspirations every 5 mL.  Performed by: Personally  Anesthesiologist: Kipp Brood, MD  Additional Notes: 15 cc 0.75% Ropivacaine injected easily

## 2020-10-14 NOTE — OR Nursing (Signed)
Pt is awake,alert and oriented. Pt is in NAD at this time. Pt and family verbalized understanding of poc and discharge instructions. instructions given to family and reviewed prior to discharge.Pt is ambulatory to wheelchair with stand by assist but not needed with steady gait.  Pt taken to front lobby and placed in car with family.   PT at bedside for physical therapy with crutches

## 2020-10-14 NOTE — H&P (Signed)
Orthopaedic Trauma Service (OTS) Consult   Patient ID: Jim Alexander MRN: [id number] DOB/AGE: Nov 04, 1995 24 y.o.    HPI: Jim Alexander is an 24 y.o. male who sustained a fall on 10/02/2020 resulting in a right ankle fracture.  Patient was seen at urgent care.  Placed into a splint and instructed to follow-up with orthopedics.  Patient was seen and evaluated by orthopedics on 10/09/2020.  We reviewed his fracture pattern.  He had some equivocal widening of his medial clear space along with equivocal findings noted on stress evaluation.  We discussed surgical versus nonoperative management.  Patient elected to proceed with a surgical intervention.  Patient presents today for ORIF of his right ankle.  Anticipate outpatient procedure.  Risks and benefits of been reviewed with the patient he wishes to proceed.  Past Medical History:  Diagnosis Date  . ADHD (attention deficit hyperactivity disorder)   . Allergic rhinitis   . Asthma   . Strep pharyngitis     Past Surgical History:  Procedure Laterality Date  . TONSILLECTOMY    . TYMPANOSTOMY TUBE PLACEMENT      History reviewed. No pertinent family history.  Social History:  reports that he quit smoking about 13 months ago. He has a 4.00 pack-year smoking history. He has never used smokeless tobacco. He reports current alcohol use of about 12.0 standard drinks of alcohol per week. He reports that he does not use drugs.  Allergies: No Known Allergies  Medications: I have reviewed the patient's current medications. Current Meds  Medication Sig  . HYDROcodone-acetaminophen (NORCO/VICODIN) 5-325 MG tablet Take 1 tablet by mouth every 6 (six) hours as needed for moderate pain or severe pain.     No results found for this or any previous visit (from the past 48 hour(s)).  No results found.  Intake/Output    None      Review of Systems  Constitutional: Negative for chills and fever.  Respiratory: Negative for  shortness of breath and wheezing.   Cardiovascular: Negative for chest pain and palpitations.  Gastrointestinal: Negative for abdominal pain, nausea and vomiting.  Neurological: Negative for tingling and sensory change.   Blood pressure 130/69, pulse 60, temperature 98.1 F (36.7 C), temperature source Oral, resp. rate 17, height 5\' 10"  (1.778 m), weight 65.8 kg, SpO2 99 %. Physical Exam Constitutional:      General: He is not in acute distress.    Appearance: Normal appearance. He is normal weight.  HENT:     Head: Normocephalic and atraumatic.  Eyes:     Extraocular Movements: Extraocular movements intact.  Cardiovascular:     Rate and Rhythm: Normal rate and regular rhythm.     Heart sounds: Normal heart sounds. No murmur heard.   Pulmonary:     Effort: Pulmonary effort is normal.     Breath sounds: Normal breath sounds.  Abdominal:     Palpations: Abdomen is soft.  Musculoskeletal:     Comments: Right Lower Extremity  Soft tissue swelling R ankle improved TTP distal fibula  Minimal tenderness medial ankle No jointline pain  DPN, SPN, TN sensation intact EHL, FHL, lesser toe motor intact nontender proximal tibia or fibula  Good knee ROM  No hip tenderness No pain with axial loading or log rolling of hip  No DCT  Compartments soft and nontender, no pain out of proportion with passive stretching   Skin:    General: Skin is warm.     Capillary Refill:  Capillary refill takes less than 2 seconds.  Neurological:     General: No focal deficit present.     Mental Status: He is alert and oriented to person, place, and time.  Psychiatric:        Attention and Perception: Attention normal.        Mood and Affect: Mood and affect normal.        Speech: Speech normal.        Cognition and Memory: Cognition normal.    Assessment/Plan:  24 y/o male s/p fall with R bimalleolar equivalent ankle fracture  -Right bimalleolar equivalent ankle fracture  OR today for ORIF right  fibula and additional stress evaluation under fluoroscopy  Nonweightbearing 6 to 8 weeks  Splint for 2 weeks and then convert to cam boot for range of motion exercises  Aggressive ice and elevation  Outpatient procedure  Risks and benefits reviewed with the patient he wishes to proceed   - Pain management:  Multimodal analgesia   - DVT/PE prophylaxis:  Aspirin 325 mg twice daily for 4 weeks  - ID:   Perioperative antibiotics   - Impediments to fracture healing:  Previous nicotine user but no current impediments identified  - Dispo:  OR for ORIF right ankle  Discharge home once stable from PACU  Follow up with ortho in 2-3 weeks    Mearl Latin, PA-C [phone] (C) 10/14/2020, 8:28 AM  [facility] [address] [place] [address] [phone] [phone] (F)    After 5pm and on the weekends please log on to Amion, go to orthopaedics and the look under the [facility] Call for the provider(s) on call. You can also call our office at [phone] and then follow the prompts to be connected to the call team.

## 2020-10-14 NOTE — Anesthesia Procedure Notes (Signed)
Anesthesia Regional Block: Popliteal block   Pre-Anesthetic Checklist: ,, timeout performed, Correct Patient, Correct Site, Correct Laterality, Correct Procedure, Correct Position, site marked, Risks and benefits discussed,  Surgical consent,  Pre-op evaluation,  At surgeon's request and post-op pain management  Laterality: Right  Prep: chloraprep       Needles:  Injection technique: Single-shot  Needle Type: Stimulator Needle - 40      Needle Gauge: 22     Additional Needles:   Procedures:, nerve stimulator,,,,,,,  Narrative:  Start time: 10/14/2020 7:35 AM End time: 10/14/2020 7:40 AM Injection made incrementally with aspirations every 5 mL.  Performed by: Personally  Anesthesiologist: Kipp Brood, MD  Additional Notes: 25 cc 0.5% Bupivacaine  10 cc 1.3% Exparel

## 2020-10-14 NOTE — OR Nursing (Signed)
PT notified pt has an order prior to leaving for PT for right leg

## 2020-10-14 NOTE — Transfer of Care (Signed)
Immediate Anesthesia Transfer of Care Note  Patient: Kiowa Hollar  Procedure(s) Performed: OPEN REDUCTION INTERNAL FIXATION (ORIF) DISTAL FIBULA FRACTURE, (Right Leg Lower)  Patient Location: PACU  Anesthesia Type:GA combined with regional for post-op pain  Level of Consciousness: awake, alert  and oriented  Airway & Oxygen Therapy: Patient Spontanous Breathing and Patient connected to nasal cannula oxygen  Post-op Assessment: Report given to RN and Post -op Vital signs reviewed and stable  Post vital signs: Reviewed and stable  Last Vitals:  Vitals Value Taken Time  BP    Temp    Pulse 60 10/14/20 1122  Resp 14 10/14/20 1122  SpO2 99 % 10/14/20 1122  Vitals shown include unvalidated device data.  Last Pain:  Vitals:   10/14/20 0711  TempSrc:   PainSc: 0-No pain      Patients Stated Pain Goal: 3 (10/14/20 0711)  Complications: No complications documented.

## 2020-10-14 NOTE — Anesthesia Procedure Notes (Signed)
Procedure Name: MAC Date/Time: 10/14/2020 9:39 AM Performed by: Mariea Clonts, CRNA Pre-anesthesia Checklist: Patient identified, Emergency Drugs available, Suction available, Patient being monitored and Timeout performed Patient Re-evaluated:Patient Re-evaluated prior to induction Oxygen Delivery Method: Nasal cannula

## 2020-10-14 NOTE — Anesthesia Preprocedure Evaluation (Signed)
Anesthesia Evaluation  Patient identified by MRN, date of birth, ID band Patient awake    Reviewed: Allergy & Precautions, NPO status , Patient's Chart, lab work & pertinent test results  Airway Mallampati: II  TM Distance: >3 FB Neck ROM: Full    Dental  (+) Teeth Intact, Dental Advisory Given   Pulmonary former smoker,    breath sounds clear to auscultation       Cardiovascular  Rhythm:Regular Rate:Normal     Neuro/Psych    GI/Hepatic   Endo/Other    Renal/GU      Musculoskeletal   Abdominal   Peds  Hematology   Anesthesia Other Findings   Reproductive/Obstetrics                             Anesthesia Physical Anesthesia Plan  ASA: I  Anesthesia Plan: General   Post-op Pain Management:  Regional for Post-op pain   Induction:   PONV Risk Score and Plan: Ondansetron and Dexamethasone  Airway Management Planned: LMA  Additional Equipment:   Intra-op Plan:   Post-operative Plan: Extubation in OR  Informed Consent: I have reviewed the patients History and Physical, chart, labs and discussed the procedure including the risks, benefits and alternatives for the proposed anesthesia with the patient or authorized representative who has indicated his/her understanding and acceptance.     Dental advisory given  Plan Discussed with: CRNA and Anesthesiologist  Anesthesia Plan Comments:         Anesthesia Quick Evaluation

## 2020-10-14 NOTE — Progress Notes (Signed)
Orthopedic Tech Progress Note Patient Details:  Jim Alexander February 27, 1996 [id number] OR RN called re questing a CAM WALKER BOOT for patient. Dropped off to OR DESK Ortho Devices Type of Ortho Device: CAM walker   Post Interventions Patient Tolerated: Other (comment) Instructions Provided: Other (comment)   Donald Pore 10/14/2020, 11:15 AM

## 2020-10-14 NOTE — Progress Notes (Signed)
Received verbal order from Dr. Carola Frost for informed consent.

## 2020-10-14 NOTE — Discharge Instructions (Signed)
Orthopaedic Trauma Service Discharge Instructions   General Discharge Instructions  Orthopaedic Injuries:  Right ankle fracture (fibula fracture) treated with open reduction and internal fixation using plate and screws   WEIGHT BEARING STATUS: Touchdown weight bearing, using crutches   RANGE OF MOTION/ACTIVITY: treat cam boot like a cast.  Only come out of it for dressing changes    Wound Care: daily wound care starting on 10/17/2020. See below  Discharge Wound Care Instructions  Do NOT apply any ointments, solutions or lotions to pin sites or surgical wounds.  These prevent needed drainage and even though solutions like hydrogen peroxide kill bacteria, they also damage cells lining the pin sites that help fight infection.  Applying lotions or ointments can keep the wounds moist and can cause them to breakdown and open up as well. This can increase the risk for infection. When in doubt call the office.  Surgical incisions should be dressed daily.  If any drainage is noted, use one layer of adaptic, then gauze, Kerlix, and an ace wrap.  Once the incision is completely dry and without drainage, it may be left open to air out.  Showering may begin 36-48 hours later.  Cleaning gently with soap and water.   DVT/PE prophylaxis: Aspirin 325 mg po daily x 4 weeks   Diet: as you were eating previously.  Can use over the counter stool softeners and bowel preparations, such as Miralax, to help with bowel movements.  Narcotics can be constipating.  Be sure to drink plenty of fluids  PAIN MEDICATION USE AND EXPECTATIONS  You have likely been given narcotic medications to help control your pain.  After a traumatic event that results in an fracture (broken bone) with or without surgery, it is ok to use narcotic pain medications to help control one's pain.  We understand that everyone responds to pain differently and each individual patient will be evaluated on a regular basis for the continued  need for narcotic medications. Ideally, narcotic medication use should last no more than 6-8 weeks (coinciding with fracture healing).   As a patient it is your responsibility as well to monitor narcotic medication use and report the amount and frequency you use these medications when you come to your office visit.   We would also advise that if you are using narcotic medications, you should take a dose prior to therapy to maximize you participation.  IF YOU ARE ON NARCOTIC MEDICATIONS IT IS NOT PERMISSIBLE TO OPERATE A MOTOR VEHICLE (MOTORCYCLE/CAR/TRUCK/MOPED) OR HEAVY MACHINERY DO NOT MIX NARCOTICS WITH OTHER CNS (CENTRAL NERVOUS SYSTEM) DEPRESSANTS SUCH AS ALCOHOL   STOP SMOKING OR USING NICOTINE PRODUCTS!!!!  As discussed nicotine severely impairs your body's ability to heal surgical and traumatic wounds but also impairs bone healing.  Wounds and bone heal by forming microscopic blood vessels (angiogenesis) and nicotine is a vasoconstrictor (essentially, shrinks blood vessels).  Therefore, if vasoconstriction occurs to these microscopic blood vessels they essentially disappear and are unable to deliver necessary nutrients to the healing tissue.  This is one modifiable factor that you can do to dramatically increase your chances of healing your injury.    (This means no smoking, no nicotine gum, patches, etc)  DO NOT USE NONSTEROIDAL ANTI-INFLAMMATORY DRUGS (NSAID'S)  Using products such as Advil (ibuprofen), Aleve (naproxen), Motrin (ibuprofen) for additional pain control during fracture healing can delay and/or prevent the healing response.  If you would like to take over the counter (OTC) medication, Tylenol (acetaminophen) is ok.  However,  some narcotic medications that are given for pain control contain acetaminophen as well. Therefore, you should not exceed more than 4000 mg of tylenol in a day if you do not have liver disease.  Also note that there are may OTC medicines, such as cold  medicines and allergy medicines that my contain tylenol as well.  If you have any questions about medications and/or interactions please ask your doctor/PA or your pharmacist.      ICE AND ELEVATE INJURED/OPERATIVE EXTREMITY  Using ice and elevating the injured extremity above your heart can help with swelling and pain control.  Icing in a pulsatile fashion, such as 20 minutes on and 20 minutes off, can be followed.    Do not place ice directly on skin. Make sure there is a barrier between to skin and the ice pack.    Using frozen items such as frozen peas works well as the conform nicely to the are that needs to be iced.  USE AN ACE WRAP OR TED HOSE FOR SWELLING CONTROL  In addition to icing and elevation, Ace wraps or TED hose are used to help limit and resolve swelling.  It is recommended to use Ace wraps or TED hose until you are informed to stop.    When using Ace Wraps start the wrapping distally (farthest away from the body) and wrap proximally (closer to the body)   Example: If you had surgery on your leg or thing and you do not have a splint on, start the ace wrap at the toes and work your way up to the thigh        If you had surgery on your upper extremity and do not have a splint on, start the ace wrap at your fingers and work your way up to the upper arm  IF YOU ARE IN A SPLINT OR CAST DO NOT REMOVE IT FOR ANY REASON   If your splint gets wet for any reason please contact the office immediately. You may shower in your splint or cast as long as you keep it dry.  This can be done by wrapping in a cast cover or garbage back (or similar)  Do Not stick any thing down your splint or cast such as pencils, money, or hangers to try and scratch yourself with.  If you feel itchy take benadryl as prescribed on the bottle for itching  IF YOU ARE IN A CAM BOOT (BLACK BOOT)  You may remove boot periodically. Perform daily dressing changes as noted below.  Wash the liner of the boot regularly and  wear a sock when wearing the boot. It is recommended that you sleep in the boot until told otherwise    Call office for the following:  Temperature greater than 101F  Persistent nausea and vomiting  Severe uncontrolled pain  Redness, tenderness, or signs of infection (pain, swelling, redness, odor or green/yellow discharge around the site)  Difficulty breathing, headache or visual disturbances  Hives  Persistent dizziness or light-headedness  Extreme fatigue  Any other questions or concerns you may have after discharge  In an emergency, call 911 or go to an Emergency Department at a nearby hospital  HELPFUL INFORMATION  ? If you had a block, it will wear off between 8-24 hrs postop typically.  This is period when your pain may go from nearly zero to the pain you would have had postop without the block.  This is an abrupt transition but nothing dangerous is happening.  You  may take an extra dose of narcotic when this happens.  ? You should wean off your narcotic medicines as soon as you are able.  Most patients will be off or using minimal narcotics before their first postop appointment.   ? We suggest you use the pain medication the first night prior to going to bed, in order to ease any pain when the anesthesia wears off. You should avoid taking pain medications on an empty stomach as it will make you nauseous.  ? Do not drink alcoholic beverages or take illicit drugs when taking pain medications.  ? In most states it is against the law to drive while you are in a splint or sling.  And certainly against the law to drive while taking narcotics.  ? You may return to work/school in the next couple of days when you feel up to it.   ? Pain medication may make you constipated.  Below are a few solutions to try in this order: - Decrease the amount of pain medication if you aren't having pain. - Drink lots of decaffeinated fluids. - Drink prune juice and/or each dried  prunes  o If the first 3 don't work start with additional solutions - Take Colace - an over-the-counter stool softener - Take Senokot - an over-the-counter laxative - Take Miralax - a stronger over-the-counter laxative     CALL THE OFFICE WITH ANY QUESTIONS OR CONCERNS: [phone]   VISIT OUR WEBSITE FOR ADDITIONAL INFORMATION: [email or web address]

## 2020-10-15 ENCOUNTER — Encounter (HOSPITAL_COMMUNITY): Payer: Self-pay | Admitting: Orthopedic Surgery

## 2020-11-03 NOTE — Op Note (Signed)
10/14/2020  11:15 PM  PATIENT:  Jim Alexander  25 y.o. male  PRE-OPERATIVE DIAGNOSIS:  RIGHT LATERAL MALLEOLUS FRACTURE  POST-OPERATIVE DIAGNOSIS:  RIGHT LATERAL MALLEOLUS FRACTURE  PROCEDURE:  Procedure(s): 1. OPEN REDUCTION INTERNAL FIXATION (ORIF) DISTAL FIBULA FRACTURE, (Right) 2.   MANUAL APPLICATION OF STRESS UNDER FLUORO RIGHT ANKLE  SURGEON:  Surgeon(s) and Role:    Myrene Galas, MD - Primary  PHYSICIAN ASSISTANT: PA Student  ANESTHESIA:   general  I/O:  No intake/output data recorded.  SPECIMEN:  No Specimen  TOURNIQUET:  * Missing tourniquet times found for documented tourniquets in log: [id number] *  COMPLICATIONS: NONE  DICTATION: .Note written in EPIC  DISPOSITION: TO PACU  CONDITION: STABLE  DELAY START OF DVT PROPHYLAXIS BECAUSE OF BLEEDING RISK: NO  BRIEF SUMMARY AND INDICATIONS FOR PROCEDURE:  The patient is a 25 y.o. who sustained a fracture dislocation of the ankle, treated with reduction at the time of presentation to the Emergency Department, followed  by splint application. Patient subsequently seen in the office to reassess soft tissues, which now show sufficient swelling resolution to allow for surgical repair.  I discussed with the patient the risks and benefits of surgery including the possibility of infection, DVT, PE, nerve injury, vessel injury, loss of motion, arthritis, symptomatic hardware, heart attack, stroke and need for further surgery, among others. We specifically discussed syndesmotic repair and that some of the implants used for this would likely require subsequent removal. After acknowledging these risks, consent was provided to proceed.  SUMMARY OF PROCEDURE:  The patient was taken to the operating room after administration of a regional block and preoperative antibiotics.  The right lower extremity was prepped and draped in the usual sterile fashion.  A tourniquet was placed about the thigh but never inflated during the procedure.   A timeout was held, and then an incision was made directly over the lateral malleolus with careful dissection to avoid injury to the superficial peroneal nerve.  The periosteum was left intact as we continued deep dissection.  The fracture site was identified and curettage and lavage used to remove hematoma.  With the assistance of distal manipulation and placement of tenaculums, we were able to obtain an anatomic reduction with  interdigitation of the primary fracture fragments.  Because of the angle and comminution of the fracture site, I was unable to place a lag screw but was able to hold this interdigitated during application of a lateral malleolus plate.  We used the Paragon system and placed standard fixation in the shaft and distal lateral malleolus, confirming plate position with x-ray and then continuing with a standard fixation as well as a locked fixation.  Final images showed appropriate reductional replacement, trajectory, and length.  I then performed an external rotation stress view under live fluoroscopy and did not identify any syndesmotic widening nor widening of the medial clear space to suggest a syndesmotic injury.  Wounds were irrigated once more and then closed in standard layered fashion using 2-0 Vicryl and 3-0 nylon.  A sterile gently compressive dressing was applied, and then a posterior and stirrup splint with the ankle extended just above neutral.   PROGNOSIS: The patient will be nonweightbearing in the splint with ice and elevation over the next 3 to 5 days.  We will plan to see her back in the office in 10-14 days for removal of sutures and transition to a Cam boot with unrestricted range of motion of the ankle and advancement of weightbearing at that  time.

## 2021-10-12 IMAGING — RF DG C-ARM 1-60 MIN
1 series · 7 of 7 positions shown · non-contrast
Comparison: October 02, 2020

FLUOROSCOPY TIME:  1 minutes 12 seconds; 7 acquired images

CLINICAL DATA: Open reduction internal fixation for fracture

EXAM:
RIGHT SCNMY-9 VIEW; DG C-ARM 1-60 MIN

[Series 1: run · 7 of 7 slices shown]
[im 1/7]
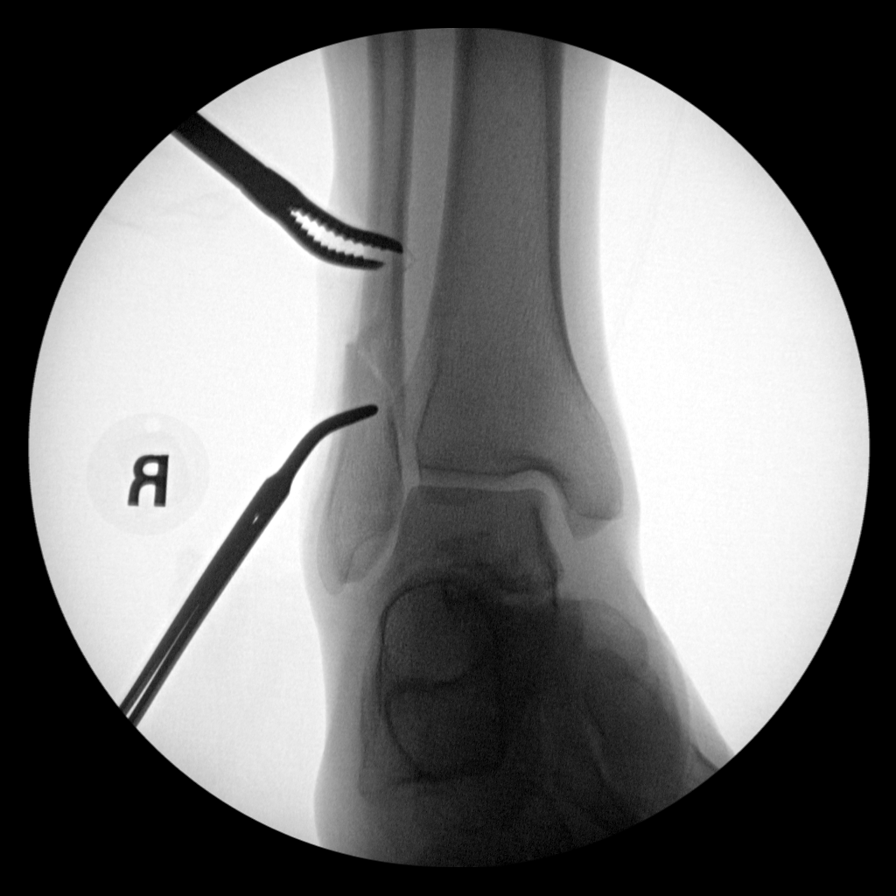
[im 2/7]
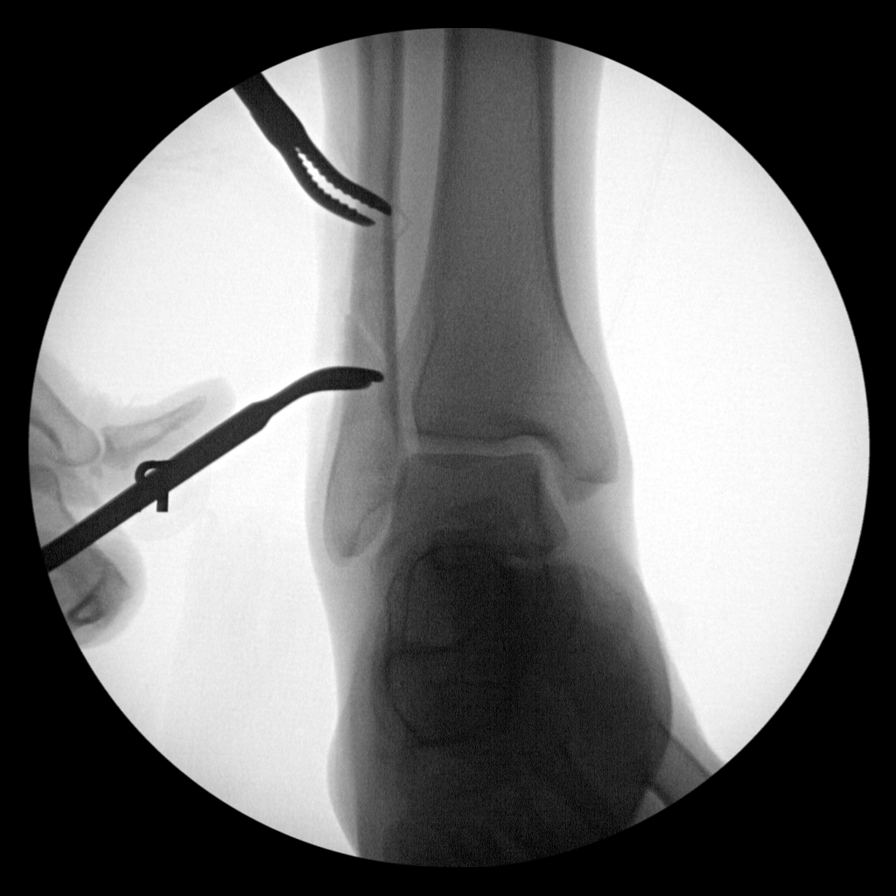
[im 3/7]
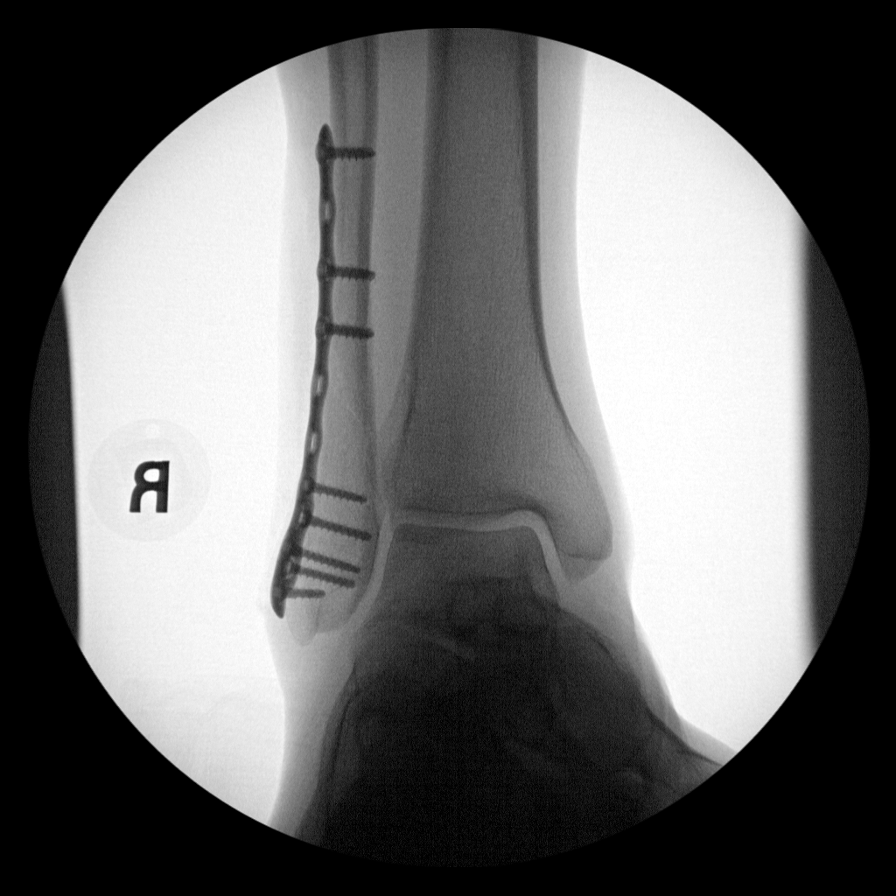
[im 4/7]
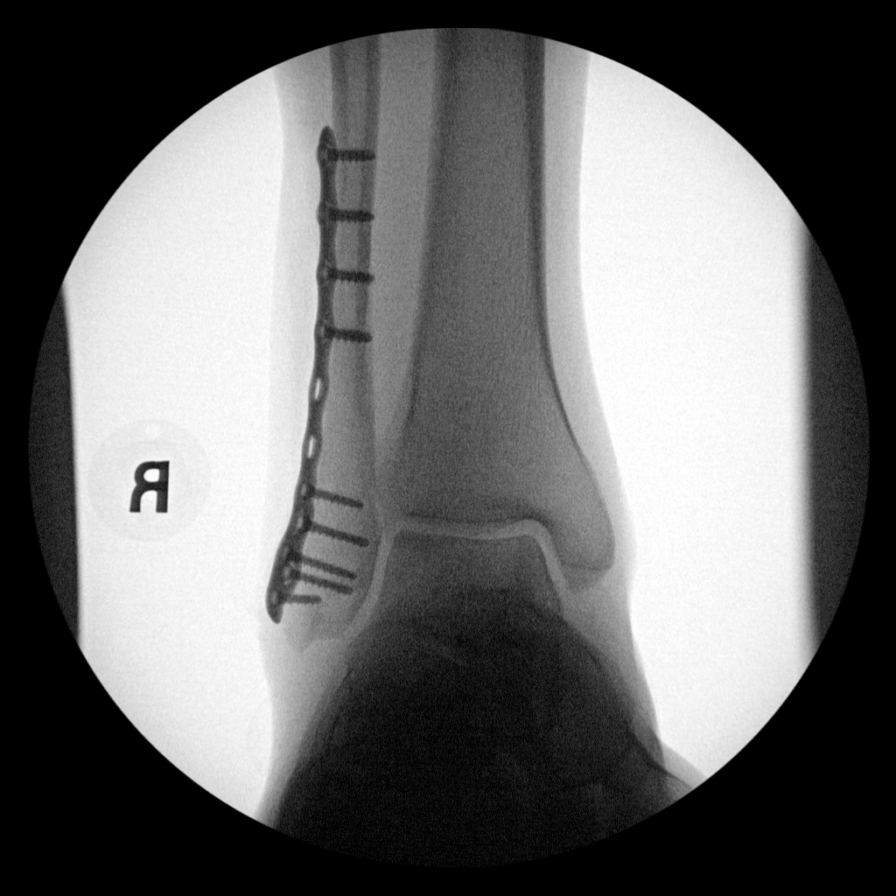
[im 5/7]
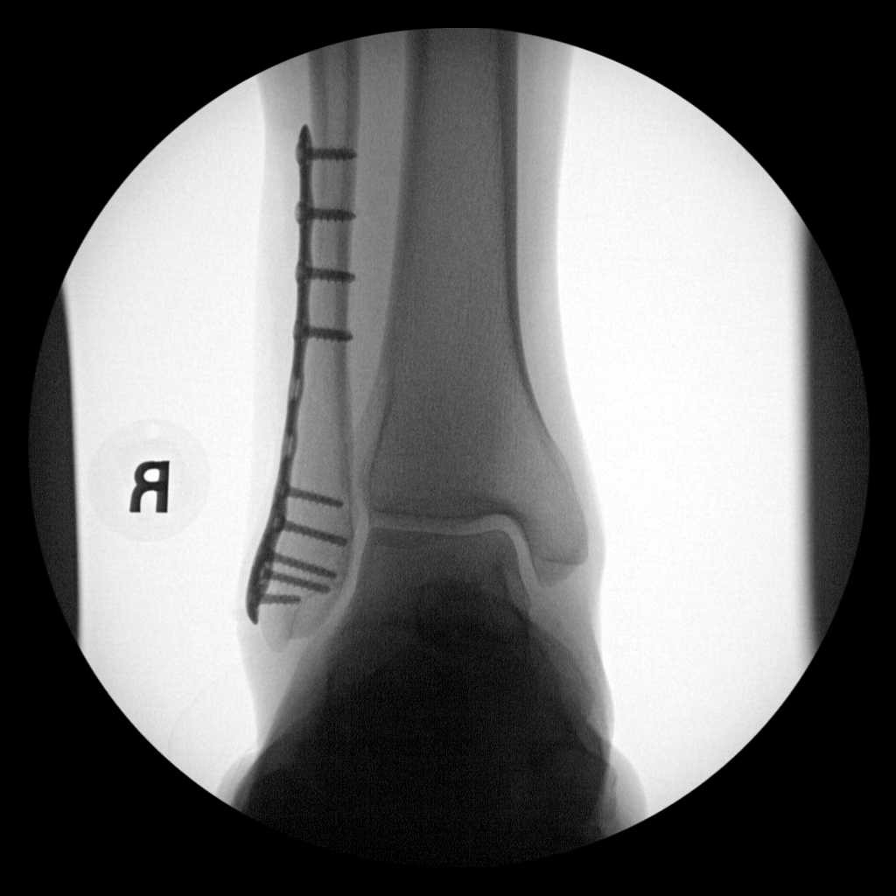
[im 6/7]
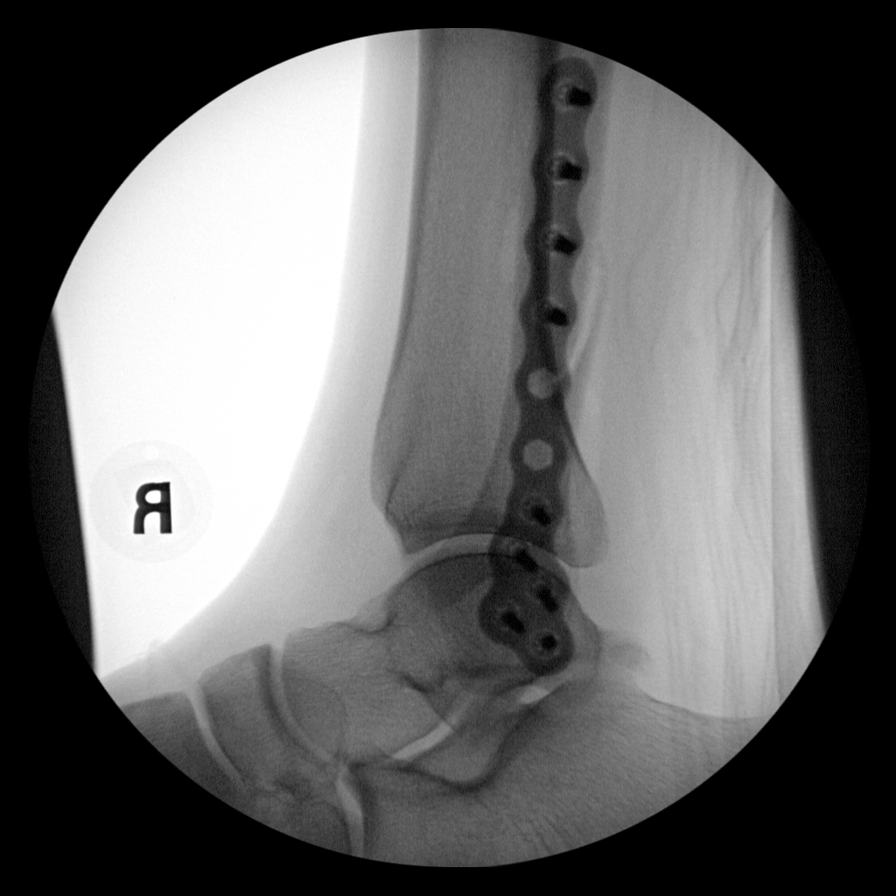
[im 7/7]
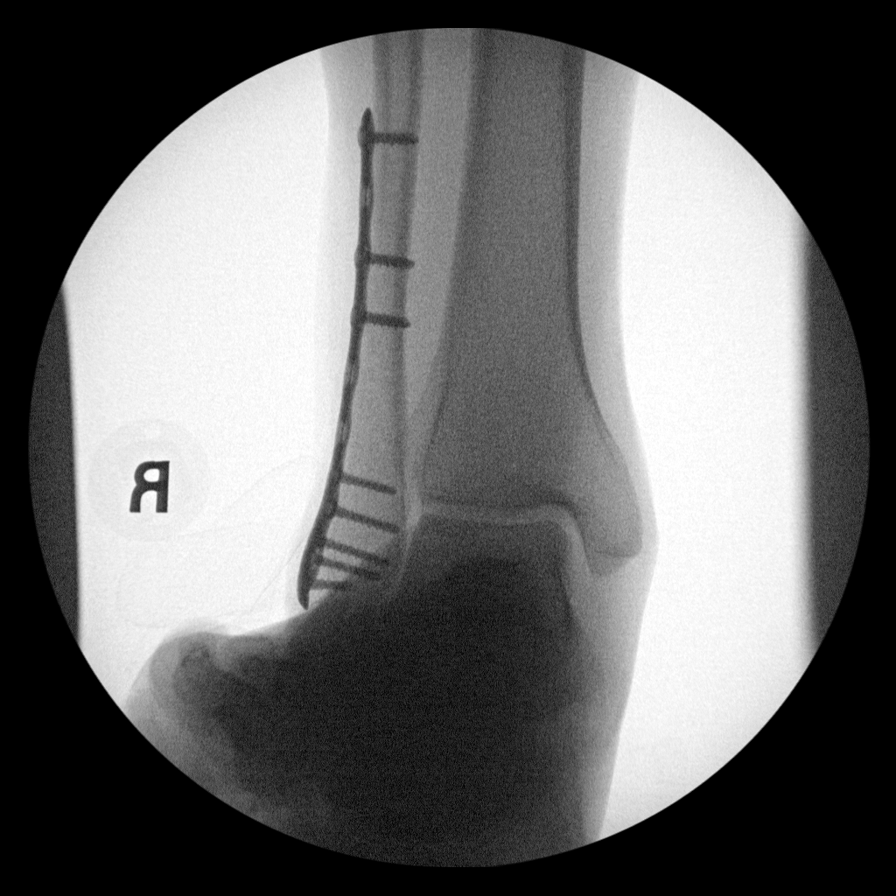

[7 of 7 positions shown; findings below may reference images not displayed]

FINDINGS: Frontal and lateral views show screw and plate fixation through a
fracture of the distal fibula with alignment essentially anatomic
after screw and plate fixation. No new fracture. Ankle mortise
appears intact. No appreciable joint space narrowing.
IMPRESSION: Screw and plate fixation for distal fibular fracture with alignment
essentially anatomic after postoperative fixation. No new fracture.
Ankle mortise appears intact.

## 2021-10-12 IMAGING — DX DG ANKLE COMPLETE 3+V*R*
1 series · 3 of 3 positions shown · non-contrast
Comparison: Right ankle radiographs-10/02/2020

CLINICAL DATA: Post sideplate fixation of lateral malleolar
fracture.

EXAM:
RIGHT ANKLE - COMPLETE 3+ VIEW

[Series 1: ankle · 0.14mm/px · 3 of 3 slices shown]
[im 1/3]
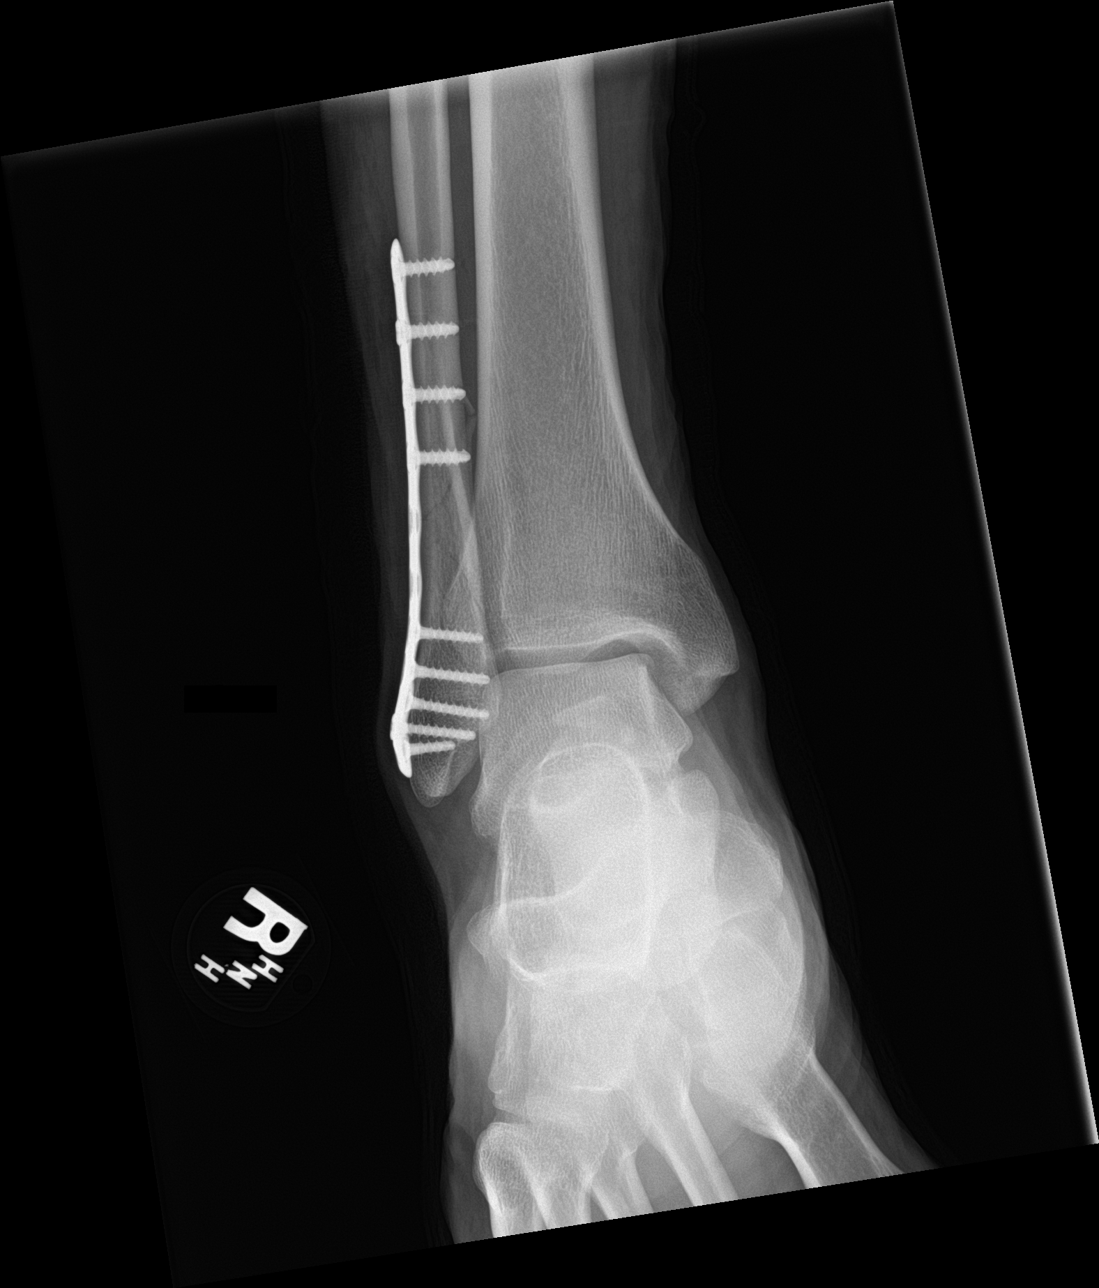
[im 2/3]
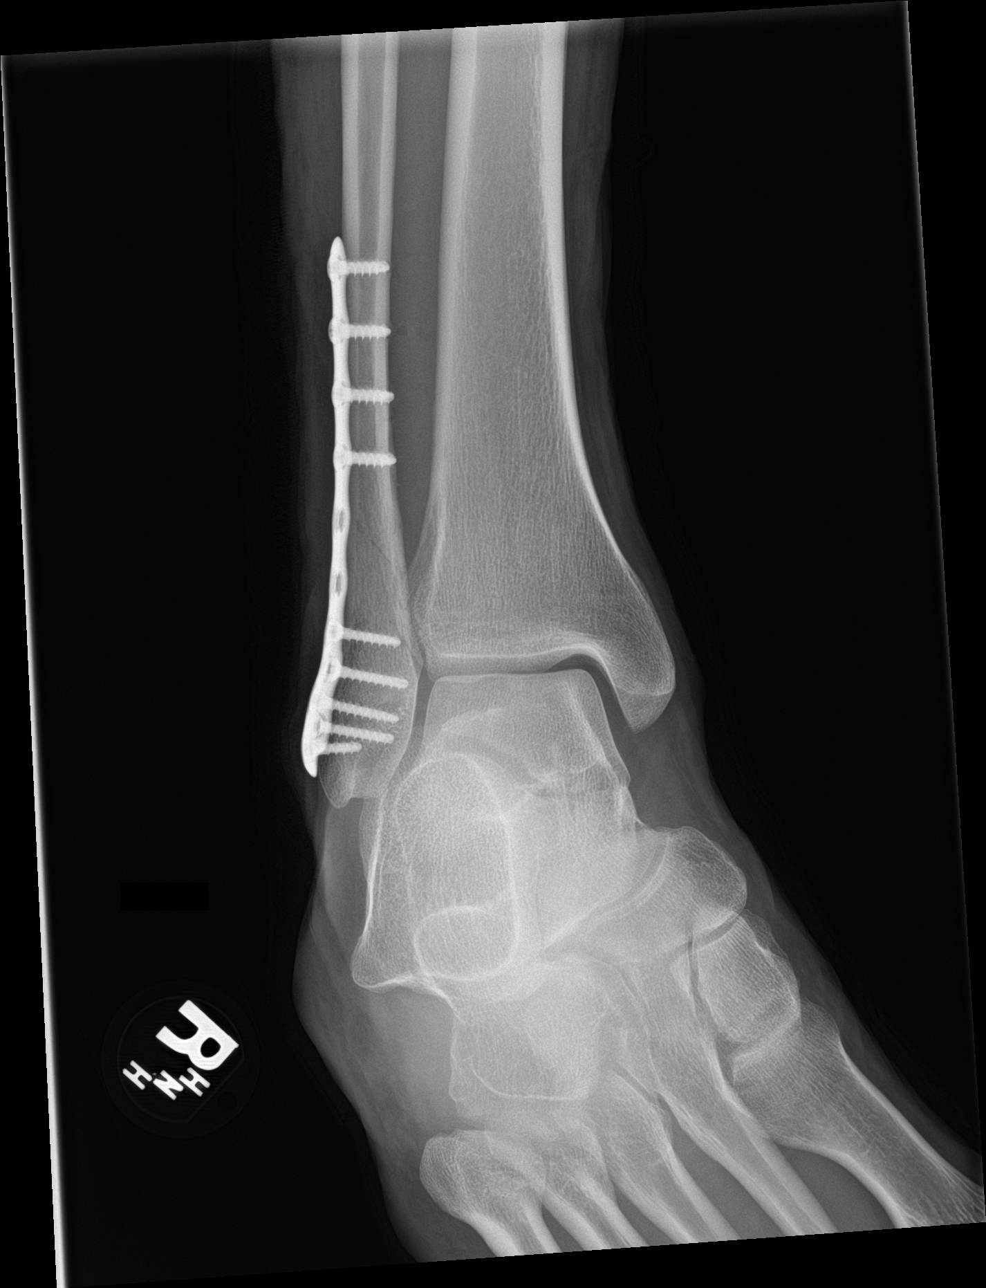
[im 3/3]
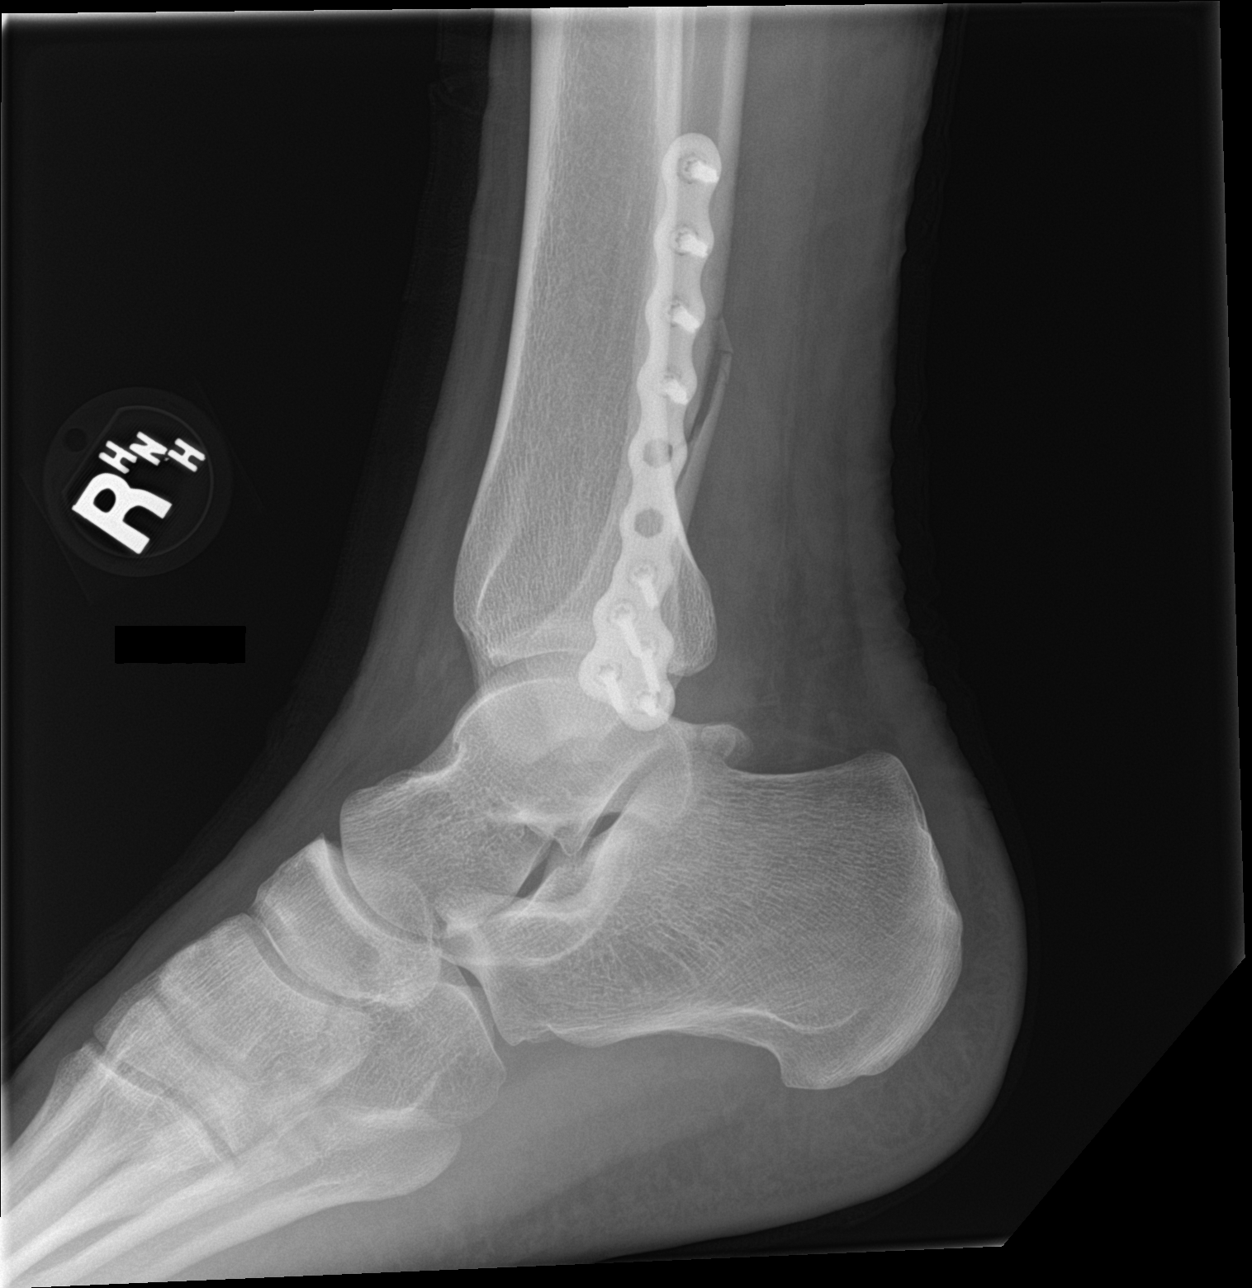

[3 of 3 positions shown; findings below may reference images not displayed]

FINDINGS: Post sideplate fixation of previously identified minimally displaced
distal fibular fracture. Alignment appears near anatomic. No
additional fractures identified. Joint spaces are preserved. The
ankle mortise is preserved. No definite ankle joint effusion.
Suspected small os trigonum. No radiopaque foreign body.
IMPRESSION: Post sideplate fixation of distal fibular fracture without evidence
of complication.

## 2022-11-20 ENCOUNTER — Telehealth: Payer: Self-pay

## 2022-11-20 NOTE — Telephone Encounter (Signed)
Mychart msg sent. AS, CMA 

## 2024-10-17 ENCOUNTER — Ambulatory Visit (HOSPITAL_COMMUNITY)
Admission: EM | Admit: 2024-10-17 | Discharge: 2024-10-17 | Disposition: A | Discharge status: Home / Self Care | Source: Home / Self Care

## 2024-10-17 ENCOUNTER — Encounter (HOSPITAL_COMMUNITY): Payer: Self-pay

## 2024-10-17 DIAGNOSIS — S0501XA Injury of conjunctiva and corneal abrasion without foreign body, right eye, initial encounter: Secondary | ICD-10-CM

## 2024-10-17 MED ORDER — TETRACAINE HCL 0.5 % OP SOLN
OPHTHALMIC | Status: AC
  Filled 2024-10-17: qty 4

## 2024-10-17 MED ORDER — FLUORESCEIN SODIUM 1 MG OP STRP
ORAL_STRIP | OPHTHALMIC | Status: AC
  Filled 2024-10-17: qty 1

## 2024-10-17 MED ORDER — ERYTHROMYCIN 5 MG/GM OP OINT: TOPICAL_OINTMENT | OPHTHALMIC | 0 refills | Status: AC

## 2024-10-17 NOTE — ED Triage Notes (Signed)
 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 States washed eye out. States can feel something in there.

## 2024-10-17 NOTE — ED Provider Notes (Signed)
 [facility]    CSN: [id number] Arrival date & time: 10/17/24  1338      History   Chief Complaint Chief Complaint  Patient presents with   Foreign Body in Eye    HPI Jim Alexander is a 28 y.o. male.   28 year old male who presents to urgent care with complaints of right eye pain, photosensitivity, discharge.  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  He initially was unable to even open the eye.  He washed the eye out significantly but still has a scratchy feeling.  He reports it still difficult to completely open his eye.  The area especially feel scratchy along the lateral aspect.  He denies any loss of vision.  He was concerned that there may be something still in the eye.   Foreign Body in Eye Pertinent negatives include no chest pain, no abdominal pain and no shortness of breath.    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 Authorizing Provider  erythromycin  ophthalmic ointment Place a 1/2 inch ribbon of ointment into the right lower eyelid for 7 days. 10/17/24  Yes Teresa Almarie LABOR, PA-C    Family History History reviewed. No pertinent family history.  Social History Social History[1]   Allergies   Patient has no known allergies.   Review of Systems Review of Systems  Constitutional:  Negative for chills and fever.  HENT:  Negative for ear pain and sore throat.   Eyes:  Positive for photophobia, pain, discharge and redness. Negative for visual disturbance.  Respiratory:  Negative for cough and shortness of breath.   Cardiovascular:  Negative for chest pain and palpitations.  Gastrointestinal:  Negative for abdominal pain and vomiting.  Genitourinary:  Negative for dysuria and hematuria.  Musculoskeletal:  Negative for arthralgias and back pain.  Skin:  Negative for color change and rash.  Neurological:  Negative for seizures and syncope.  All other systems reviewed and are negative.    Physical Exam Triage Vital Signs ED Triage Vitals  Encounter Vitals Group     BP 10/17/24 1555 130/70     Girls Systolic BP Percentile --      Girls Diastolic BP Percentile --      Boys Systolic BP Percentile --      Boys Diastolic BP Percentile --      Pulse Rate 10/17/24 1555 64     Resp 10/17/24 1555 16     Temp 10/17/24 1555 98 F (36.7 C)     Temp Source 10/17/24 1555  Oral     SpO2 10/17/24 1555 97 %     Weight --      Height --      Head Circumference --      Peak Flow --      Pain Score 10/17/24 1553 6     Pain Loc --      Pain Education --      Exclude from Growth Chart --    No data found.  Updated Vital Signs BP 130/70 (BP Location: Right Arm)   Pulse 64   Temp 98 F (36.7 C) (Oral)   Resp 16   SpO2 97%   Visual Acuity Right Eye Distance: 20/20 Left Eye Distance: 20/20 Bilateral Distance: 20/20  Right Eye Near:   Left Eye Near:    Bilateral Near:     Physical Exam Vitals and nursing note reviewed.  Constitutional:      General: He is not in acute distress.    Appearance: He is well-developed.  HENT:     Head:  Normocephalic and atraumatic.  Eyes:     Conjunctiva/sclera: Conjunctivae normal.  Cardiovascular:     Rate and Rhythm: Normal rate and regular rhythm.     Heart sounds: No murmur heard. Pulmonary:     Effort: Pulmonary effort is normal. No respiratory distress.     Breath sounds: Normal breath sounds.  Abdominal:     Palpations: Abdomen is soft.     Tenderness: There is no abdominal tenderness.  Musculoskeletal:        General: No swelling.     Cervical back: Neck supple.  Skin:    General: Skin is warm and dry.     Capillary Refill: Capillary refill takes less than 2 seconds.  Neurological:     Mental Status: He is alert.  Psychiatric:        Mood and Affect: Mood normal.      UC Treatments / Results  Labs (all labs ordered are listed, but only abnormal results are displayed) Labs Reviewed - No data to display  EKG   Radiology No results found.  Procedures Procedures (including critical care time)  Medications Ordered in UC Medications - No data to display  Initial Impression / Assessment and Plan / UC Course  I have reviewed the triage vital signs and the nursing notes.  Pertinent labs & imaging results that were available during my care of the patient were reviewed by me and considered in my medical decision making (see chart for details).     Abrasion of right cornea, initial encounter   Fluorescein  staining done today shows a small right lateral corneal abrasion and there also appears to be an abrasion under the upper eyelid.  This is small enough that it should heal within a few days.  We will treat this with erythromycin  ointment twice daily into the eye for 7 days.  Recommend using sunglasses for light sensitivity and wearing safety glasses while at work. Return to urgent care or PCP if symptoms worsen or fail to resolve.    Final Clinical Impressions(s) / UC Diagnoses   Final diagnoses:  Abrasion of right cornea, initial encounter     Discharge  Instructions      Fluorescein  staining done today shows a small right lateral corneal abrasion and there also appears to be an abrasion under the upper eyelid.  This is small enough that it should heal within a few days.  We will treat this with erythromycin  ointment twice  daily into the eye for 7 days.  Recommend using sunglasses for light sensitivity and wearing safety glasses while at work. Return to urgent care or PCP if symptoms worsen or fail to resolve.      ED Prescriptions     Medication Sig Dispense Auth. Provider   erythromycin  ophthalmic ointment Place a 1/2 inch ribbon of ointment into the right lower eyelid for 7 days. 3.5 g Teresa Almarie LABOR, [place]      PDMP not reviewed this encounter.    [1]  Social History Tobacco Use   Smoking status: Former    Current packs/day: 0.00    Average packs/day: 1 pack/day for 4.0 years (4.0 ttl pk-yrs)    Types: Cigarettes    Start date: 09/03/2015    Quit date: 09/03/2019    Years since quitting: 5.1   Smokeless tobacco: Never   Tobacco comments:    4 cigarettes per day  Substance Use Topics   Alcohol use: Yes    Alcohol/week: 12.0 standard drinks of alcohol    Types: 12 Cans of beer per week   Drug use: No     Teresa Almarie LABOR, PA-C 10/17/24 1625

## 2024-10-17 NOTE — Discharge Instructions (Addendum)
 Fluorescein  staining done today shows a small right lateral corneal abrasion and there also appears to be an abrasion under the upper eyelid.  This is small enough that it should heal within a few days.  We will treat this with erythromycin  ointment twice daily into the eye for 7 days.  Recommend using sunglasses for light sensitivity and wearing safety glasses while at work. Return to urgent care or PCP if symptoms worsen or fail to resolve.
